# Patient Record
Sex: Female | Born: 1935 | Race: White | Hispanic: No | Smoking: Never smoker
Health system: Southern US, Community
[De-identification: ages and names within clinical notes are randomized; demographics above are authoritative.]

## PROBLEM LIST (undated history)

## (undated) DIAGNOSIS — I739 Peripheral vascular disease, unspecified: Secondary | ICD-10-CM

## (undated) DIAGNOSIS — F419 Anxiety disorder, unspecified: Secondary | ICD-10-CM

## (undated) DIAGNOSIS — I10 Essential (primary) hypertension: Secondary | ICD-10-CM

## (undated) DIAGNOSIS — E785 Hyperlipidemia, unspecified: Secondary | ICD-10-CM

## (undated) DIAGNOSIS — I4891 Unspecified atrial fibrillation: Secondary | ICD-10-CM

## (undated) DIAGNOSIS — I639 Cerebral infarction, unspecified: Secondary | ICD-10-CM

## (undated) HISTORY — PX: ABDOMINAL HYSTERECTOMY: SHX81

---

## 2004-05-03 ENCOUNTER — Ambulatory Visit: Payer: Self-pay | Admitting: Unknown Physician Specialty

## 2004-09-28 ENCOUNTER — Ambulatory Visit: Payer: Self-pay | Admitting: Ophthalmology

## 2004-10-05 ENCOUNTER — Ambulatory Visit: Payer: Self-pay | Admitting: Ophthalmology

## 2004-11-17 ENCOUNTER — Ambulatory Visit: Payer: Self-pay | Admitting: Ophthalmology

## 2004-11-23 ENCOUNTER — Ambulatory Visit: Payer: Self-pay | Admitting: Ophthalmology

## 2005-09-24 ENCOUNTER — Emergency Department: Payer: Self-pay | Admitting: Emergency Medicine

## 2005-10-03 ENCOUNTER — Ambulatory Visit: Payer: Self-pay | Admitting: Unknown Physician Specialty

## 2006-02-06 ENCOUNTER — Other Ambulatory Visit: Payer: Self-pay

## 2006-02-06 ENCOUNTER — Inpatient Hospital Stay: Payer: Self-pay | Admitting: Internal Medicine

## 2006-03-05 ENCOUNTER — Emergency Department: Payer: Self-pay | Admitting: Emergency Medicine

## 2006-03-07 ENCOUNTER — Ambulatory Visit: Payer: Self-pay | Admitting: Unknown Physician Specialty

## 2007-01-21 ENCOUNTER — Ambulatory Visit: Payer: Self-pay | Admitting: Unknown Physician Specialty

## 2007-01-22 ENCOUNTER — Ambulatory Visit: Payer: Self-pay | Admitting: Unknown Physician Specialty

## 2007-03-12 ENCOUNTER — Emergency Department: Payer: Self-pay | Admitting: Emergency Medicine

## 2007-03-12 ENCOUNTER — Other Ambulatory Visit: Payer: Self-pay

## 2008-04-14 ENCOUNTER — Emergency Department: Payer: Self-pay | Admitting: Emergency Medicine

## 2012-12-04 LAB — TROPONIN I
Troponin-I: <0.02 ng/mL
Troponin-I: <0.02 ng/mL

## 2012-12-04 LAB — COMPREHENSIVE METABOLIC PANEL
Albumin: 3.7 g/dL (ref 3.4–5.0)
Anion Gap: 6 — ABNORMAL LOW (ref 7–16)
Bilirubin,Total: 0.9 mg/dL (ref 0.2–1.0)
Chloride: 100 mmol/L (ref 98–107)
Co2: 24 mmol/L (ref 21–32)
Creatinine: 0.58 mg/dL — ABNORMAL LOW (ref 0.60–1.30)
EGFR (African American): >60
Potassium: 3.7 mmol/L (ref 3.5–5.1)
SGOT(AST): 28 U/L (ref 15–37)
SGPT (ALT): 21 U/L (ref 12–78)
Sodium: 130 mmol/L — ABNORMAL LOW (ref 136–145)

## 2012-12-04 LAB — URINALYSIS, COMPLETE
Bacteria: NONE SEEN
Bilirubin,UR: NEGATIVE
Blood: NEGATIVE
Glucose,UR: NEGATIVE mg/dL (ref 0–75)
Hyaline Cast: 1
Leukocyte Esterase: NEGATIVE
Nitrite: NEGATIVE
Ph: 7 (ref 4.5–8.0)
Protein: NEGATIVE
RBC,UR: 1 /HPF (ref 0–5)
Specific Gravity: 1.012 (ref 1.003–1.030)
Squamous Epithelial: NONE SEEN
WBC UR: <1 /HPF (ref 0–5)

## 2012-12-04 LAB — CK-MB: CK-MB: 4.2 ng/mL — ABNORMAL HIGH (ref 0.5–3.6)

## 2012-12-04 LAB — CBC
MCH: 29.2 pg (ref 26.0–34.0)
MCHC: 33.3 g/dL (ref 32.0–36.0)
RBC: 4.87 10*6/uL (ref 3.80–5.20)
WBC: 13.7 10*3/uL — ABNORMAL HIGH (ref 3.6–11.0)

## 2012-12-04 LAB — TSH: Thyroid Stimulating Horm: 0.877 u[IU]/mL

## 2012-12-05 ENCOUNTER — Inpatient Hospital Stay: Payer: Self-pay | Admitting: Student

## 2012-12-05 LAB — CBC WITH DIFFERENTIAL/PLATELET
Basophil #: 0 10*3/uL (ref 0.0–0.1)
Basophil %: 0.3 %
Eosinophil #: 0 10*3/uL (ref 0.0–0.7)
HCT: 40.4 % (ref 35.0–47.0)
HGB: 13.3 g/dL (ref 12.0–16.0)
Lymphocyte #: 0.8 10*3/uL — ABNORMAL LOW (ref 1.0–3.6)
MCH: 29.1 pg (ref 26.0–34.0)
MCHC: 32.9 g/dL (ref 32.0–36.0)
Monocyte #: 1.1 x10 3/mm — ABNORMAL HIGH (ref 0.2–0.9)
Monocyte %: 9.8 %
Neutrophil %: 83 %
Platelet: 223 10*3/uL (ref 150–440)
RDW: 14.5 % (ref 11.5–14.5)

## 2012-12-05 LAB — COMPREHENSIVE METABOLIC PANEL
Alkaline Phosphatase: 66 U/L (ref 50–136)
BUN: 12 mg/dL (ref 7–18)
Calcium, Total: 8 mg/dL — ABNORMAL LOW (ref 8.5–10.1)
Chloride: 103 mmol/L (ref 98–107)
Co2: 20 mmol/L — ABNORMAL LOW (ref 21–32)
Creatinine: 0.57 mg/dL — ABNORMAL LOW (ref 0.60–1.30)
Osmolality: 264 (ref 275–301)
SGPT (ALT): 30 U/L (ref 12–78)
Sodium: 132 mmol/L — ABNORMAL LOW (ref 136–145)

## 2012-12-05 LAB — CK-MB: CK-MB: 3.8 ng/mL — ABNORMAL HIGH (ref 0.5–3.6)

## 2013-06-02 ENCOUNTER — Emergency Department: Payer: Self-pay | Admitting: Emergency Medicine

## 2013-06-02 LAB — CBC
HCT: 37.3 % (ref 35.0–47.0)
HGB: 12.8 g/dL (ref 12.0–16.0)
MCH: 30.2 pg (ref 26.0–34.0)
MCHC: 34.4 g/dL (ref 32.0–36.0)
MCV: 88 fL (ref 80–100)
RDW: 14.4 % (ref 11.5–14.5)

## 2013-06-02 LAB — BASIC METABOLIC PANEL
Anion Gap: 6 — ABNORMAL LOW (ref 7–16)
BUN: 22 mg/dL — ABNORMAL HIGH (ref 7–18)
Chloride: 101 mmol/L (ref 98–107)
Co2: 26 mmol/L (ref 21–32)
Osmolality: 270 (ref 275–301)
Potassium: 4.1 mmol/L (ref 3.5–5.1)
Sodium: 133 mmol/L — ABNORMAL LOW (ref 136–145)

## 2013-06-02 LAB — URINALYSIS, COMPLETE
Bilirubin,UR: NEGATIVE
Ketone: NEGATIVE
Nitrite: NEGATIVE
Ph: 7 (ref 4.5–8.0)
RBC,UR: 9 /HPF (ref 0–5)
WBC UR: 52 /HPF (ref 0–5)

## 2014-10-23 NOTE — Consult Note (Signed)
PATIENT NAME:  Bridget BorerSUTTON, Illiana L MR#:  [id number] DATE OF BIRTH:  1914-09-08  DATE OF CONSULTATION:  12/05/2012  REFERRING PHYSICIAN:  Katharina Caperima Vaickute, MD   CONSULTING PHYSICIAN:  Lamar BlinksBruce J. Anaclara Acklin, MD  REASON FOR CONSULTATION: Atrial fibrillation with rapid ventricular rate with cerebrovascular accident, and carotid atherosclerosis and hypertension, needing further treatment.   CHIEF COMPLAINT: The patient has had disorientation and was admitted for that issue and, therefore, has had an EKG showing atrial fibrillation with controlled ventricular rate. Currently, the patient is stable and with no evidence of syncope.  The patient has had carotid atherosclerosis with previous carotid endarterectomy and previous multiple strokes, on appropriate medications including antiplatelet including Plavix. The patient has had no recent evidence of stroke, although disorientation may be a consideration at this time as a stroke or TIA.  The patient has had an EKG showing atrial fibrillation with controlled ventricular rate and has no evidence of significant congestive heart failure or myocardial infarction at this time.   REVIEW OF SYSTEMS: The remainder of review of systems is negative for syncope, dizziness, nausea, chest pain, diaphoresis, weakness, fatigue, vision change, ringing in the ears, hearing loss, cough, congestion, heartburn, nausea, vomiting, diarrhea, bloody stools, stomach pain, extremity pain, leg weakness, cramping of the buttocks, known blood clots, headaches, blackouts, dizzy spells, nosebleeds, frequent urination, urination at night.   PAST MEDICAL HISTORY: 1.  Multiple previous strokes.  2.  Atrial fibrillation.  3.  Carotid atherosclerosis, status post carotid endarterectomy.  4.  Hypertension.   FAMILY HISTORY: No family members with early onset of cardiovascular disease or hypertension.   SOCIAL HISTORY: Currently denies alcohol or tobacco use.   ALLERGIES:  As listed.    MEDICATIONS:  As listed.   PHYSICAL EXAMINATION: VITAL SIGNS: Blood pressure is 122/68 bilateral,  heart rate 74, upright, reclining and irregular.  GENERAL: She is a well-appearing, elderly female in no acute distress.  HEENT: No icterus, thyromegaly, ulcers, hemorrhage or xanthelasma. CARDIOVASCULAR: Irregularly irregular with normal S1, S2 without murmur, gallop or rub.  PMI is inferiorly displaced. Carotid upstroke normal without bruit. Jugular venous pressure is normal.  LUNGS: Have a few basilar crackles with normal respirations.  ABDOMEN: Soft, nontender, without hepatosplenomegaly or masses.  EXTREMITIES: 2+ bilateral pulses in dorsal, pedal, radial and femoral arteries without lower extremity edema, cyanosis, clubbing, or ulcers.  NEUROLOGIC: The patient is somewhat obtunded.  ASSESSMENT: A 81 year old female with history of cerebrovascular accident with obtundation consistent with TIA, having atrial fibrillation with controlled ventricular rate and concerns of fall risk by family reports, hypertension, needing further treatment options.   RECOMMENDATIONS: 1.  No additional medications management for atrial fibrillation due to controlled ventricular rate at this point and concerns of hypotension and fall risk.  2.  Antiplatelet medication management for previous strokes including Plavix as the patient tolerates.  3.  No full anticoagulation due to concerns of fall risk and bleeding risks.  4.  Echocardiogram for LV systolic dysfunction, valvular heart disease causing atrial fibrillation.  5.  Ambulate and follow for further stroke and symptoms.   ____________________________ Lamar BlinksBruce J. Tailer Volkert, MD [email or web address] D: 12/05/2012 18:58:32 ET T: 12/05/2012 20:15:05 ET JOB#: [id number]  cc: Lamar BlinksBruce J. Kanyla Omeara, MD, <Dictator> Lamar BlinksBRUCE J Tomica Arseneault MD ELECTRONICALLY SIGNED 12/09/2012 17:09

## 2014-10-23 NOTE — Consult Note (Signed)
CHIEF COMPLAINT and HISTORY:  Subjective/Chief Complaint confusion   History of Present Illness 81 yo WF who still lives at home alone.  Admitted with hallucinations.  Found to have right parietal infarcts on MR.  [place] suggested >75% left carotid stenosis.  Has a history of left CEA in 1996.  No significant right carotid stenosis on [place].  Has had multiple nonspecific TIAs previously.   PAST MEDICAL/SURGICAL HISTORY:  Past Medical History:   TIA - Transient Ischemic Attack:    HTN:    Hysterectomy - Total:    Cataract Extraction:   ALLERGIES:  Allergies:  Other -Explain in Comment: Unknown  HOME MEDICATIONS:  Home Medications: Medication Instructions Status  Plavix 75 mg oral tablet 1 tab(s) orally once a day Active  lisinopril 20 mg oral tablet 1 tab(s) orally once a day Active  Norvasc 5 mg oral tablet 1 tab(s) orally once a day Active  nitrofurantoin macrocrystals macrocrystals 50 mg oral capsule 1 cap(s) orally once a day Active   Family and Social History:  Family History Non-Contributory   Social History negative tobacco, negative ETOH   Place of Living Home   Review of Systems:  Fever/Chills No   Cough No   Sputum No   Abdominal Pain Yes   Diarrhea No   Constipation No   Nausea/Vomiting No   SOB/DOE No   Chest Pain No   Telemetry Reviewed Afib   Dysuria No   Tolerating PT Yes   Tolerating Diet Yes   Medications/Allergies Reviewed Medications/Allergies reviewed   Physical Exam:  GEN well developed, well nourished   HEENT pink conjunctivae, moist oral mucosa   NECK No masses  trachea midline   RESP normal resp effort  no use of accessory muscles   CARD irregular rate  positive carotid bruits  no JVD   ABD denies tenderness  normal BS   LYMPH negative neck, negative axillae   EXTR negative cyanosis/clubbing, negative edema   SKIN normal to palpation, skin turgor good   NEURO motor/sensory function intact, somewhat confused but  apparently much better than admission   PSYCH alert, A+O to time, place, person   LABS:  Laboratory Results: Thyroid:    04-Jun-14 09:26, Thyroid Stimulating Hormone  Thyroid Stimulating Hormone -  0.45-4.50  (International Unit)   -----------------------  Pregnant patients have   different reference   ranges for TSH:   - - - - - - - - - -   Pregnant, first trimetser:   0.36 - 2.50 uIU/mL    04-Jun-14 10:06, Thyroid Stimulating Hormone  Thyroid Stimulating Hormone 0.877  0.45-4.50  (International Unit)   -----------------------  Pregnant patients have   different reference   ranges for TSH:   - - - - - - - - - -   Pregnant, first trimetser:   0.36 - 2.50 uIU/mL  LabObservation:    05-Jun-14 08:36, Echo Doppler  OBSERVATION   Reason for Test  Hepatic:    04-Jun-14 09:26, Comprehensive Metabolic Panel  Bilirubin, Total -  Alkaline Phosphatase -  SGPT (ALT) -  SGOT (AST) -  Total Protein, Serum -  Albumin, Serum -    04-Jun-14 10:06, Comprehensive Metabolic Panel  Bilirubin, Total 0.9  Alkaline Phosphatase 70  SGPT (ALT) 21  SGOT (AST) 28  Total Protein, Serum 7.2  Albumin, Serum 3.7    05-Jun-14 02:22, Comprehensive Metabolic Panel  Bilirubin, Total 1.3  Alkaline Phosphatase 66  SGPT (ALT) 30  SGOT (AST) 37  Total Protein,  Serum 6.7  Albumin, Serum 3.3  Routine Micro:    04-Jun-14 10:42, Urine Culture  Micro Text Report   URINE CULTURE    COMMENT                   NO GROWTH IN 8-12 HOURS     ANTIBIOTIC  Specimen Source   IN AND OUT CATH  Culture Comment   NO GROWTH IN 8-12 HOURS   Result(s) reported on 05 Dec 2012 at 10:17AM.  Cardiology:    04-Jun-14 09:19, ED ECG  Ventricular Rate 123  Atrial Rate 133  QRS Duration 64  QT 398  QTc 569  R Axis -86  T Axis 71  ECG interpretation   Atrial fibrillation with rapid ventricular response with premature ventricular or aberrantly conducted complexes  Left axis deviation  Low voltage QRS  Inferior  infarct (cited on or before 06-Feb-2006)  Cannot rule out Anterior infarct (cited on or before 06-Feb-2006)  Abnormal ECG  When compared with ECG of 12-Mar-2007 14:08,  Atrial fibrillation has replaced Sinus rhythm  Vent. rate has increased BY  51 BPM  QRS duration has decreased  Nonspecific T wave abnormality now evident in Lateral leads  ----------unconfirmed----------  Confirmed by OVERREAD, NOT ([id number]), editor PEARSON, BARBARA ([id number]) on 12/05/2012 3:19:02 PM  ED ECG     05-Jun-14 08:36, Echo Doppler  Echo Doppler   REASON FOR EXAM:      COMMENTS:       PROCEDURE: [place] - ECHO DOPPLER COMPLETE(TRANSTHOR)  - Dec 05 2012  8:36AM     RESULT: Echocardiogram Report    Patient Name:   Bridget Livingston Date of Exam: 12/05/2012  Medical Rec #:  [id number]                 Custom1:  Date of Birth:  07-Aug-1914              Height:       61.8 in  Patient Age:    76 years               Weight:       127.9 lb  Patient Gender: F                      BSA:          1.58 m??    Indications: Atrial Fib  Sonographer:    Sherrie Sport RDCS  Referring Phys: Ether Griffins, [place]    Summary:   1. Left ventricular ejection fraction, by visual estimation, is 40 to   45%.   2. Mildly decreased global left ventricular systolic function.   3. Moderately enlarged right ventricle.   4. Severely dilated left atrium.   5. Severely dilated right atrium.   6. Moderate mitral valve regurgitation.   7. Moderate tricuspid regurgitation.   8. Mildly elevated pulmonary artery systolic pressure.  2D AND M-MODE MEASUREMENTS (normal ranges within parentheses):  Left Ventricle:          Normal  IVSd (2D):      0.85 cm (0.7-1.1)  LVPWd (2D):     0.81 cm (0.7-1.1) Aorta/LA:                  Normal  LVIDd (2D):     3.76 cm (3.4-5.7) Aortic Root (2D): 2.80 cm (2.4-3.7)  LVIDs (2D):     2.59 cm           Left Atrium (  2D): 3.90 cm (1.9-4.0)  LV FS (2D):     31.1 %   (>25%)  LV EF (2D):     59.7 %   (>50%)                                     Right Ventricle:                                    RVd (2D):        0.35 cm  LV DIASTOLIC FUNCTION:  MV Peak E: 1.18 m/s Decel Time: 116 msec  SPECTRAL DOPPLER ANALYSIS (where applicable):  Mitral Valve:  MV P1/2 Time: 33.64 msec  MV Area, PHT: 6.54 cm??  Aortic Valve: AoV Max Vel: 0.77 m/s AoV Peak PG: 2.4 mmHg AoV Mean PG:  LVOT Vmax: 0.42 m/s LVOT VTI:  LVOT Diameter: 2.20 cm  AoV Area, Vmax: 2.04 cm?? AoV Area, VTI:  AoV Area, Vmn:  Tricuspid Valve and PA/RV Systolic Pressure: TR Max Velocity: 2.83 m/s RA   Pressure: 5 mmHg RVSP/PASP: 37.0 mmHg  Pulmonic Valve:  PV Max Velocity: 0.56 m/s PV Max PG: 1.2 mmHg PV Mean PG:    PHYSICIAN INTERPRETATION:  Left Ventricle: The left ventricular internal cavity size was normal. LV   posterior wall thickness was normal. Global LV systolic function was   mildly decreased. Left ventricular ejection fraction, by visual  estimation, is 40 to 45%.  Right Ventricle: The right ventricular size is moderately enlarged.  Left Atrium: The left atrium is severely dilated.  Right Atrium: The right atrium is severely dilated.  Pericardium: There is no evidence of pericardialeffusion.  Mitral Valve: Moderate mitral valve regurgitation is seen.  Tricuspid Valve: Moderate tricuspid regurgitation is visualized. The   tricuspid regurgitant velocity is 2.83 m/s, and with an assumed right   atrial pressure of 5 mmHg, the estimated right ventricular systolic   pressure is mildly elevated at 37.0 mmHg.  Aortic Valve: The aortic valve is trileaflet and structurally normal,   with normal leaflet excursion; without any evidence of aortic stenosis or   insufficiency.  Aorta: Theaortic root and ascending aorta are structurally normal, with   no evidence of dilitation.    [id number] Serafina Royals MD  Electronically signed by [id number] Serafina Royals MD  Signature Date/Time: 12/05/2012/1:48:30 PM  *** Final ***    IMPRESSION: .        Verified By: Corey Skains  ([facility]), M.D., MD  Routine Chem:    04-Jun-14 09:26, Comprehensive Metabolic Panel  Glucose, Serum -  BUN -  Creatinine (comp) -  Sodium, Serum -  Potassium, Serum -  Chloride, Serum -  CO2, Serum -  Calcium (Total), Serum -  Osmolality (calc) -  eGFR (African American) -  eGFR (Non-African American) -  eGFR values <70mL/min/1.73 m2 may be an indication of chronic  kidney disease (CKD).  Calculated eGFR is useful in patients with stable renal function.  The eGFR calculation will not be reliable in acutely ill patients  when serum creatinine is changing rapidly. It is not useful in   patients on dialysis. The eGFR calculation may not be applicable  to patients at the low and high extremes of body sizes, pregnant  women, and vegetarians.  Anion Gap -    04-Jun-14 10:06, Comprehensive Metabolic Panel  Glucose, Serum 110  BUN 13  Creatinine (comp) 0.58  Sodium, Serum 130  Potassium, Serum 3.7  Chloride, Serum 100  CO2, Serum 24  Calcium (Total), Serum 8.6  Osmolality (calc) 262  eGFR (African American) >60  eGFR (Non-African American) >60  eGFR values <6mL/min/1.73 m2 may be an indication of chronic  kidney disease (CKD).  Calculated eGFR is useful in patients with stable renal function.  The eGFR calculation will not be reliable in acutely ill patients  when serum creatinine is changing rapidly. It is not useful in   patients on dialysis. The eGFR calculation may not be applicable  to patients at the low and high extremes of body sizes, pregnant  women, and vegetarians.  Anion Gap 6    05-Jun-14 02:22, Comprehensive Metabolic Panel  Glucose, Serum 102  BUN 12  Creatinine (comp) 0.57  Sodium, Serum 132  Potassium, Serum 3.9  Chloride, Serum 103  CO2, Serum 20  Calcium (Total), Serum 8.0  Osmolality (calc) 264  eGFR (African American) >60  eGFR (Non-African American) >60  eGFR values <66mL/min/1.73 m2 may be an indication of chronic  kidney disease  (CKD).  Calculated eGFR is useful in patients with stable renal function.  The eGFR calculation will not be reliable in acutely ill patients  when serum creatinine is changing rapidly. It is not useful in   patients on dialysis. The eGFR calculation may not be applicable  to patients at the low and high extremes of body sizes, pregnant  women, and vegetarians.  Anion Gap 9  Cardiac:    04-Jun-14 09:26, CK, Total  CK, Total -  Result(s) reported on 04 Dec 2012 at 09:59AM.    04-Jun-14 09:26, Troponin I  Troponin I -  0.00-0.05  0.05 ng/mL or less: NEGATIVE   Repeat testing in 3-6 hrs   if clinically indicated.  >0.05 ng/mL: POTENTIAL   MYOCARDIAL INJURY. Repeat   testing in 3-6 hrs if   clinically indicated.  NOTE: An increase or decrease   of 30% or more on serial   testing suggests a   clinically important change    04-Jun-14 10:06, CK, Total  CK, Total 305  Result(s) reported on 04 Dec 2012 at 10:39AM.    04-Jun-14 10:06, Troponin I  Troponin I < 0.02  0.00-0.05  0.05 ng/mL or less: NEGATIVE   Repeat testing in 3-6 hrs   if clinically indicated.  >0.05 ng/mL: POTENTIAL   MYOCARDIAL INJURY. Repeat   testing in 3-6 hrs if   clinically indicated.  NOTE: An increase or decrease   of 30% or more on serial   testing suggests a   clinically important change    04-Jun-14 18:31, CPK-MB, Serum  CPK-MB, Serum 4.2  Result(s) reported on 04 Dec 2012 at 07:23PM.    04-Jun-14 18:31, Troponin I  Troponin I < 0.02  0.00-0.05  0.05 ng/mL or less: NEGATIVE   Repeat testing in 3-6 hrs   if clinically indicated.  >0.05 ng/mL: POTENTIAL   MYOCARDIAL INJURY. Repeat   testing in 3-6 hrs if   clinically indicated.  NOTE: An increase or decrease   of 30% or more on serial   testing suggests a   clinically important change    05-Jun-14 02:22, CPK-MB, Serum  CPK-MB, Serum 3.8  Result(s) reported on 05 Dec 2012 at 03:00AM.    05-Jun-14 02:22, Troponin I  Troponin I < 0.02   0.00-0.05  0.05 ng/mL or less: NEGATIVE   Repeat testing  in 3-6 hrs   if clinically indicated.  >0.05 ng/mL: POTENTIAL   MYOCARDIAL INJURY. Repeat   testing in 3-6 hrs if   clinically indicated.  NOTE: An increase or decrease   of 30% or more on serial   testing suggests a   clinically important change  Routine UA:    04-Jun-14 10:42, Urinalysis  Color (UA) Yellow  Clarity (UA) Clear  Glucose (UA) Negative  Bilirubin (UA) Negative  Ketones (UA) 1+  Specific Gravity (UA) 1.012  Blood (UA) Negative  pH (UA) 7.0  Protein (UA) Negative  Nitrite (UA) Negative  Leukocyte Esterase (UA) Negative  Result(s) reported on 04 Dec 2012 at 11:29AM.  RBC (UA) 1 /HPF  WBC (UA) <1 /HPF  Bacteria (UA)   NONE SEEN  Epithelial Cells (UA)   NONE SEEN  Mucous (UA) PRESENT  Hyaline Cast (UA) 1 /LPF  Result(s) reported on 04 Dec 2012 at 11:29AM.  Routine Hem:    04-Jun-14 09:26, Hemogram, Platelet Count  WBC (CBC) 13.7  RBC (CBC) 4.87  Hemoglobin (CBC) 14.2  Hematocrit (CBC) 42.7  Platelet Count (CBC) 259  Result(s) reported on 04 Dec 2012 at 09:39AM.  MCV 88  MCH 29.2  MCHC 33.3  RDW 14.6    05-Jun-14 02:22, CBC Profile  WBC (CBC) 11.6  RBC (CBC) 4.57  Hemoglobin (CBC) 13.3  Hematocrit (CBC) 40.4  Platelet Count (CBC) 223  MCV 88  MCH 29.1  MCHC 32.9  RDW 14.5  Neutrophil % 83.0  Lymphocyte % 6.6  Monocyte % 9.8  Eosinophil % 0.3  Basophil % 0.3  Neutrophil # 9.6  Lymphocyte # 0.8  Monocyte # 1.1  Eosinophil # 0.0  Basophil # 0.0  Result(s) reported on 05 Dec 2012 at 02:42AM.   RADIOLOGY:  Radiology Results: XRay:    04-Jun-14 09:46, Chest PA and Lateral  Chest PA and Lateral  REASON FOR EXAM:    altered mental status  COMMENTS:       PROCEDURE: DXR - DXR CHEST PA (OR AP) AND LATERAL  - Dec 04 2012  9:46AM     RESULT: The cardiac silhouette is enlarged. The left hemidiaphragm is   obscured. The pulmonary vascularity is notengorged. The bony thorax   exhibits  no acute abnormality. There is no definite pleural effusion on   the lateral film.    IMPRESSION:   1. I do not see evidence of acute thoracic injury.  2. There is enlargement of the cardiac silhouette which may reflect   underlying CHF.  3. There is no pneumothorax or pleural effusion.   Dictation Site: 2        Verified By: DAVID A. Martinique, M.D., MD  [place]:    05-Jun-14 10:53, US Carotid Doppler Bilateral  US Carotid Doppler Bilateral  REASON FOR EXAM:    confused, r/o CVA  COMMENTS:       PROCEDURE: [place]  - US CAROTID DOPPLER BILATERAL  - Dec 05 2012 10:53AM     RESULT: Grayscale and color flow Doppler techniques were employed to   evaluate the cervical carotid systems.    On the right there is a moderate amount of calcified and soft plaque in   the distal CCA and bulb and proximal ICA. The waveform patterns and color   flow images do not suggest significant stenotic lesions. On the right the   peak internal carotid systolic velocity measured 71 cm/sec and the peak   common carotid velocity measured 59 cm/  sec corresponding to a ratio of   1.2.  On the left is somewhat more calcified and soft plaque demonstrated   especially in the distal CCA where the degree of visual stenosis is   between 75 and 95%. The waveform patterns on the left demonstrate minimal   turbulence. On the left the peak internal carotid systolic velocity   measured 43 cm/sec and the peak common carotid velocity measured 375   cm/sec. This results in avery low ratio of 0.1. This is due to the fact   that the stenotic lesion is in the distal CCA rather than at the bulb or   proximal ICA.    The vertebral arteries are normal in flow direction bilaterally.    IMPRESSION:    1. There is a considerableamount of calcified and soft plaque in the   distal CCA with the degree of stenosis in the 75 to 95% range. This is   based on considerable acceleration of the peak common carotid velocity to     375 cm/sec.   The carotid bulb and proximal ICA on the left reveal no   significant stenoses.  2. On the right there is no evidence of a hemodynamically significant   stenosis.  3. The vertebral arteries are normal in flow direction bilaterally.  4. The cardiac rhythm is seen to be irregular.     Dictation Site: 2        Verified By: DAVID A. Martinique, M.D., MD  LabUnknown:    04-Jun-14 09:33, CT Head Without Contrast  PACS Image    04-Jun-14 09:46, Chest PA and Lateral  PACS Image    05-Jun-14 09:58, MRI Brain Without Contrast  PACS Image    05-Jun-14 10:53, US Carotid Doppler Bilateral  PACS Image    05-Jun-14 15:22, CT Angiography Neck (Carotids)  PACS Image  MRI:    05-Jun-14 09:58, MRI Brain Without Contrast  MRI Brain Without Contrast  REASON FOR EXAM:    confused, hallucinating, CVA?  COMMENTS:       PROCEDURE: MR  - MR BRAIN WO CONTRAST  - Dec 05 2012  9:58AM     RESULT: History: Confused    Technique: Multiplanar, multisequence MRI of the brain was obtained   without IV contrast.     Comparison:  None    Findings:     There are punctate foci of restricted diffusion in the right parietal     lobe with corresponding ADC hypointensity consistent with punctate acute   infarcts. There is no hemorrhage. There is no pathologic extra-axial   fluid collection. There is generalized cerebral atrophy. There is   periventricular and deep white matter T2 and FLAIR hyperintensity likely   secondary to microangiopathy. There is no hydrocephalus. The ventricles   are normal for age. The basal cisterns are patent. There are no abnormal   extra-axial fluid collections.     The visualized paranasal sinuses and mastoid sinuses are clear. The skull   base and calvarium demonstrate normal signal. The major intracranial flow   voids, including dural venous sinuses appear patent.    IMPRESSION:     Two punctate acute nonhemorrhagic right parietal lobe infarcts.  Dictation Site:  1        Verified By: Jennette Banker, M.D., MD  CT:    04-Jun-14 09:33, CT Head Without Contrast  CT Head Without Contrast  REASON FOR EXAM:    altered mental status  COMMENTS:  PROCEDURE: CT  - CT HEAD WITHOUT CONTRAST  - Dec 04 2012  9:33AM     RESULT: Comparison:  None    Technique: Multiple axial images from the foramen magnum to the vertex   were obtained without IV contrast.    Findings:      There is no evidence of mass effect, midline shift, or extra-axial fluid   collections.  There is no evidence of a space-occupying lesion or   intracranial hemorrhage. There is no evidence of a cortical-based area of     acute infarction. There is generalized cerebral atrophy. There is   periventricular white matter low attenuation likely secondary to   microangiopathy.    The ventricles and sulci are appropriate for the patient's age. The basal   cisterns are patent.    Visualized portions of the orbits are unremarkable. The visualized   portions of the paranasal sinuses and mastoid air cells are unremarkable.   Cerebrovascular atherosclerotic calcifications are noted.    The osseous structures are unremarkable.    IMPRESSION:    No acute intracranial process.        Dictation Site: 1        Verified By: Jennette Banker, M.D., MD    05-Jun-14 15:22, CT Angiography Neck (Carotids)  CT Angiography Neck (Carotids)  REASON FOR EXAM:    CVA with left carotid stenosis on ultrasound  COMMENTS:       PROCEDURE: CT  - CT ANGIOGRAPHY NECK W/CONTRAST  - Dec 05 2012  3:22PM     RESULT: Indication: CVA    Comparisons: None    Technique: 100 ml of Isovue 370 was administered and the extracranial   carotid arteries bilaterally were scanned during the arterial phase from   the level of the superior portion of the frontal sinuses to the proximal   neck. These images were then transferred to the Siemens work station and   were subsequently reviewed utilizing 3-D  reconstructions and MIP images.  Findings:     A three-vessel configuration of the aortic arch is identified with normal   ostium. The vertebral arteries are identified arising from the subclavian   arteries and entering the transverse foramen bilaterally at C6.     The carotid arteries are identified and are symmetric and unremarkable.   There is no evidence of aneurysm or carotid dissection bilaterally.     Right Carotid Artery: There is mild apical right plaque within the right   carotid bulb and proximal ICA. There is less than 50% stenosis.    Left Carotid Artery: There is moderate distal left CCA atherosclerotic   plaque resulting in greater than 75% stenosis. There is mild   atherosclerotic plaque within the carotid bulb without focal stenosis.  The visualized portions of the brain are unremarkable.     There is degenerative disc disease with discogenic endplate osteophytes   and the space narrowing at C5-C6 and C6-C7.    There is a small right pleural effusion.    IMPRESSION:     1. Moderate distal left CCA atherosclerotic plaque resulting in greater   than 75% stenosis.    2. Less than 50% right carotid artery stenosis.    Dictation Site: 1    Verified By: Jennette Banker, M.D., MD   ASSESSMENT AND PLAN:  Assessment/Admission Diagnosis recurrent left carotid stenosis right sided stroke   Plan The recurrent left ICA stenosis is not likely the cause of her stroke.  A.  fib and BP issues much more likely to be the cause.  CT angiogram bein performed and I will review.  If this is an asymptomatic recurrent left carotid stenosis in a 81 yo, medical management alone is likely appropriate.  Will review CT angiogram and follow up with patient tomorrow.   level 4   Electronic Signatures: Algernon Huxley (MD)  (Signed 05-Jun-14 16:48)  Authored: Chief Complaint and History, PAST MEDICAL/SURGICAL HISTORY, ALLERGIES, HOME MEDICATIONS, Family and Social History, Review of  Systems, Physical Exam, LABS, RADIOLOGY, Assessment and Plan   Last Updated: 05-Jun-14 16:48 by Algernon Huxley (MD)
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
## 2014-10-23 NOTE — Discharge Summary (Signed)
PATIENT NAME:  Bridget Livingston, Bridget Livingston MR#:  [id number] DATE OF BIRTH:  [birth year]  DATE OF ADMISSION:  12/05/2012 DATE OF DISCHARGE:  06/092014  CONSULTANTS: 1.  Dr. Wyn Quaker from vascular surgery. 2.  Dr. Gwen Pounds from cardiology.   CHIEF COMPLAINT: Fall.   DISCHARGE DIAGNOSES: 1.  Two punctate acute nonhemorrhagic right parietal lobe infarcts.  2.  Chronic atrial fibrillation.  3.  A 75% carotid artery stenosis on the left.  4.  History of carotid endarterectomy on the left.  5.  Hypertension.  6.  Hyperlipidemia.  7.  History of recurrent transient ischemic attacks.  8.  History of peripheral vascular disease.  9.  History of generalized weakness.  10.  History of gait instability.  11.  Acute onset of confusion and hallucinations possibly from the acute infarcts, back to baseline now.   DISCHARGE MEDICATIONS:  Plavix 75 mg daily, lisinopril 20 mg daily, Norvasc 5 mg daily, rosuvastatin 5 mg daily, metoprolol tartrate 50 mg every 12 hours, calcium with vitamin D 1 tab once a day.   DIET: Low sodium.   ACTIVITY: As tolerated.   FOLLOWUP: Please follow with PCP, cardiologist and vascular surgery within 1 to 2 weeks.   DISPOSITION: To rehab.    CODE STATUS:  The patient is a FULL CODE.    SIGNIFICANT LABS AND IMAGING:  CT of the head without contrast on June 4th: No acute intracranial process.  CT angio of the neck on June 5th: Showing moderate distal left CCA atherosclerotic plaque resulting in greater than 75% stenosis, less than 70% right-sided carotid artery stenosis.  MRI of the brain: As above.  Ultrasound of the carotids, bilateral:  Showing considerable amount of calcified and soft plaque in the distal common carotid artery 75 to 95% on the left; on the right, no evidence of hemodynamically significant stenosis. Vertebral arteries are normal in flow direction bilaterally. Initial BUN 13, creatinine 0.58, sodium 130. Last sodium 132. LFTs on arrival within normal limits. Troponin  negative x 3. Initial CK total of 305. Initial WBC 13.7, hemoglobin 14.2, platelets 259. Urinalysis not suggestive of infection. Urine cultures no growth to date. Echocardiogram: Showing EF of 40 to 45%, severely dilated left atrium, severely dilated right atrium, moderate mitral valve regurgitation and tricuspid regurgitation. Mildly elevated pulmonary artery systolic pressure.   HISTORY AND PHYSICAL: For full details of the H and P, please see the dictation done June 4th by Dr. Winona Legato; but briefly, this is a pleasant 81 year old female with history of recurrent TIAs, history of hypertension on Plavix who came in after a fall. She also had some confusion, hallucinations and was seeing things in the house.  She was found on the floor. She was admitted to the hospitalist service.   HOSPITAL COURSE:  She was noted to have chronic a-fib. She had mild leukocytosis and mild elevation of CK total likely secondary to a fall. She had a CT of the head which was negative for acute stroke; nonetheless, given her history of confusion, hallucinations which were sudden onset, stroke was suspected, and the patient underwent further studies including MRI of the brain which did find 2 punctate areas of strokes. Ultrasound of the carotids was done, the result of which is above, and a CT angio was done and vascular surgery was consulted. Cardiology was consulted for the paroxysmal atrial fibrillation, and the patient underwent an echocardiogram as well showing EF of 40% to 45%. The patient does not appear to be in acute CHF, and this  is all probably chronic systolic CHF. In regards to the acute onset of confusion, hallucinations and fall, this could be from the acute infarcts, and this is improving, and she is back to baseline. She has been started on a statin, Plavix was resumed. Anticoagulation was offered, but she is fall risk, and per cardiology and family not a good candidate. She seen by PT and will be discharged to rehab.  She is to follow with Dr. Wyn Quakerew as an outpatient for the carotid artery stenosis. Her a-fib is controlled. Her blood pressure is acceptable for age at this point. Her blood pressure medications have been adjusted, and amlodipine, metoprolol and ACE inhibitor will be given to her after discharge.  TOTAL TIME SPENT: 35 minutes.   ____________________________ Krystal EatonShayiq Clementine Soulliere, MD sa:cb D: 12/09/2012 13:30:34 ET T: 12/09/2012 14:34:23 ET JOB#: [id number]  cc: Krystal EatonShayiq Marelly Wehrman, MD, <Dictator> Reola MosherAndrew S. Randa LynnLamb, MD Annice NeedyJason S. Dew, MD Lamar BlinksBruce J. Kowalski, MD Krystal EatonSHAYIQ Kort Stettler MD ELECTRONICALLY SIGNED 12/27/2012 20:58

## 2014-12-23 ENCOUNTER — Encounter: Payer: Self-pay | Admitting: Emergency Medicine

## 2014-12-23 ENCOUNTER — Inpatient Hospital Stay
Admission: EM | Admit: 2014-12-23 | Discharge: 2014-12-26 | DRG: 683 | Disposition: A | Discharge status: Skilled Nursing Facility | Payer: Medicare Other | Attending: Internal Medicine | Admitting: Internal Medicine

## 2014-12-23 DIAGNOSIS — Z8673 Personal history of transient ischemic attack (TIA), and cerebral infarction without residual deficits: Secondary | ICD-10-CM

## 2014-12-23 DIAGNOSIS — Z515 Encounter for palliative care: Secondary | ICD-10-CM | POA: Diagnosis not present

## 2014-12-23 DIAGNOSIS — I4891 Unspecified atrial fibrillation: Secondary | ICD-10-CM | POA: Diagnosis present

## 2014-12-23 DIAGNOSIS — N19 Unspecified kidney failure: Secondary | ICD-10-CM

## 2014-12-23 DIAGNOSIS — N390 Urinary tract infection, site not specified: Secondary | ICD-10-CM | POA: Diagnosis present

## 2014-12-23 DIAGNOSIS — I739 Peripheral vascular disease, unspecified: Secondary | ICD-10-CM | POA: Diagnosis present

## 2014-12-23 DIAGNOSIS — E876 Hypokalemia: Secondary | ICD-10-CM | POA: Diagnosis present

## 2014-12-23 DIAGNOSIS — F0391 Unspecified dementia with behavioral disturbance: Secondary | ICD-10-CM | POA: Diagnosis present

## 2014-12-23 DIAGNOSIS — F419 Anxiety disorder, unspecified: Secondary | ICD-10-CM | POA: Diagnosis present

## 2014-12-23 DIAGNOSIS — E870 Hyperosmolality and hypernatremia: Secondary | ICD-10-CM

## 2014-12-23 DIAGNOSIS — F03918 Unspecified dementia, unspecified severity, with other behavioral disturbance: Secondary | ICD-10-CM

## 2014-12-23 DIAGNOSIS — Z66 Do not resuscitate: Secondary | ICD-10-CM | POA: Diagnosis present

## 2014-12-23 DIAGNOSIS — I10 Essential (primary) hypertension: Secondary | ICD-10-CM | POA: Diagnosis present

## 2014-12-23 DIAGNOSIS — R197 Diarrhea, unspecified: Secondary | ICD-10-CM | POA: Diagnosis present

## 2014-12-23 DIAGNOSIS — E871 Hypo-osmolality and hyponatremia: Secondary | ICD-10-CM | POA: Diagnosis present

## 2014-12-23 DIAGNOSIS — E860 Dehydration: Secondary | ICD-10-CM | POA: Diagnosis present

## 2014-12-23 DIAGNOSIS — N179 Acute kidney failure, unspecified: Secondary | ICD-10-CM | POA: Diagnosis present

## 2014-12-23 DIAGNOSIS — E785 Hyperlipidemia, unspecified: Secondary | ICD-10-CM | POA: Diagnosis present

## 2014-12-23 HISTORY — DX: Unspecified atrial fibrillation: I48.91

## 2014-12-23 HISTORY — DX: Essential (primary) hypertension: I10

## 2014-12-23 HISTORY — DX: Peripheral vascular disease, unspecified: I73.9

## 2014-12-23 HISTORY — DX: Hyperlipidemia, unspecified: E78.5

## 2014-12-23 HISTORY — DX: Anxiety disorder, unspecified: F41.9

## 2014-12-23 HISTORY — DX: Cerebral infarction, unspecified: I63.9

## 2014-12-23 LAB — URINALYSIS COMPLETE WITH MICROSCOPIC (ARMC ONLY)
Bacteria, UA: NONE SEEN
Bilirubin Urine: NEGATIVE
Glucose, UA: NEGATIVE mg/dL
Hgb urine dipstick: NEGATIVE
Nitrite: NEGATIVE
PH: 5 (ref 5.0–8.0)
PROTEIN: 30 mg/dL — AB
Specific Gravity, Urine: 1.023 (ref 1.005–1.030)

## 2014-12-23 LAB — COMPREHENSIVE METABOLIC PANEL
ALK PHOS: 66 U/L (ref 38–126)
ALT: 19 U/L (ref 14–54)
AST: 24 U/L (ref 15–41)
Albumin: 3.3 g/dL — ABNORMAL LOW (ref 3.5–5.0)
Anion gap: 14 (ref 5–15)
BILIRUBIN TOTAL: 0.7 mg/dL (ref 0.3–1.2)
BUN: 47 mg/dL — ABNORMAL HIGH (ref 6–20)
CO2: 22 mmol/L (ref 22–32)
Calcium: 9.2 mg/dL (ref 8.9–10.3)
Chloride: 114 mmol/L — ABNORMAL HIGH (ref 101–111)
Creatinine, Ser: 1.67 mg/dL — ABNORMAL HIGH (ref 0.44–1.00)
GFR calc non Af Amer: 24 mL/min — ABNORMAL LOW (ref >60)
GFR, EST AFRICAN AMERICAN: 28 mL/min — AB (ref >60)
Glucose, Bld: 116 mg/dL — ABNORMAL HIGH (ref 65–99)
POTASSIUM: 3.4 mmol/L — AB (ref 3.5–5.1)
Sodium: 150 mmol/L — ABNORMAL HIGH (ref 135–145)
TOTAL PROTEIN: 7.6 g/dL (ref 6.5–8.1)

## 2014-12-23 LAB — CBC
HCT: 39.1 % (ref 35.0–47.0)
Hemoglobin: 12.6 g/dL (ref 12.0–16.0)
MCH: 28.7 pg (ref 26.0–34.0)
MCHC: 32.2 g/dL (ref 32.0–36.0)
MCV: 89 fL (ref 80.0–100.0)
PLATELETS: 249 10*3/uL (ref 150–440)
RBC: 4.4 MIL/uL (ref 3.80–5.20)
RDW: 15.1 % — ABNORMAL HIGH (ref 11.5–14.5)
WBC: 9.5 10*3/uL (ref 3.6–11.0)

## 2014-12-23 MED ORDER — ONDANSETRON HCL 4 MG/2ML IJ SOLN: 4.0000 mg | Freq: Four times a day (QID) | INTRAMUSCULAR | Status: DC | PRN

## 2014-12-23 MED ORDER — DIAZEPAM 5 MG/ML IJ SOLN
2.5000 mg | Freq: Once | INTRAMUSCULAR | Status: AC
  Administered 2014-12-23: 2.5 mg via INTRAVENOUS

## 2014-12-23 MED ORDER — CETYLPYRIDINIUM CHLORIDE 0.05 % MT LIQD
7.0000 mL | Freq: Two times a day (BID) | OROMUCOSAL | Status: DC
  Administered 2014-12-23 – 2014-12-25 (×4): 7 mL via OROMUCOSAL

## 2014-12-23 MED ORDER — CALCIUM CARBONATE-VITAMIN D 500-200 MG-UNIT PO TABS
1.0000 | ORAL_TABLET | Freq: Every day | ORAL | Status: DC
  Administered 2014-12-24 – 2014-12-25 (×2): 1 via ORAL
  Filled 2014-12-23 (×3): qty 1

## 2014-12-23 MED ORDER — LISINOPRIL 20 MG PO TABS: 20.0000 mg | ORAL_TABLET | Freq: Every day | ORAL | Status: DC

## 2014-12-23 MED ORDER — DIAZEPAM 5 MG/ML IJ SOLN
INTRAMUSCULAR | Status: AC
  Administered 2014-12-23: 2.5 mg via INTRAVENOUS
  Filled 2014-12-23: qty 2

## 2014-12-23 MED ORDER — ADULT MULTIVITAMIN W/MINERALS CH
1.0000 | ORAL_TABLET | Freq: Every day | ORAL | Status: DC
  Administered 2014-12-24 – 2014-12-25 (×2): 1 via ORAL
  Filled 2014-12-23 (×3): qty 1

## 2014-12-23 MED ORDER — ACETAMINOPHEN 325 MG PO TABS: 650.0000 mg | ORAL_TABLET | ORAL | Status: DC | PRN

## 2014-12-23 MED ORDER — QUETIAPINE FUMARATE 25 MG PO TABS
25.0000 mg | ORAL_TABLET | Freq: Every day | ORAL | Status: DC
  Administered 2014-12-24 – 2014-12-25 (×2): 25 mg via ORAL
  Filled 2014-12-23 (×3): qty 1

## 2014-12-23 MED ORDER — ATORVASTATIN CALCIUM 20 MG PO TABS
20.0000 mg | ORAL_TABLET | Freq: Every day | ORAL | Status: DC
  Administered 2014-12-24 – 2014-12-25 (×2): 20 mg via ORAL
  Filled 2014-12-23 (×3): qty 1

## 2014-12-23 MED ORDER — LORATADINE 10 MG PO TABS: 10.0000 mg | ORAL_TABLET | Freq: Every day | ORAL | Status: DC | PRN

## 2014-12-23 MED ORDER — METOPROLOL TARTRATE 25 MG PO TABS
25.0000 mg | ORAL_TABLET | Freq: Two times a day (BID) | ORAL | Status: DC
  Administered 2014-12-23 – 2014-12-25 (×5): 25 mg via ORAL
  Filled 2014-12-23 (×6): qty 1

## 2014-12-23 MED ORDER — DIPHENHYDRAMINE HCL 25 MG PO TABS
25.0000 mg | ORAL_TABLET | Freq: Four times a day (QID) | ORAL | Status: DC | PRN
  Filled 2014-12-23: qty 1

## 2014-12-23 MED ORDER — QUETIAPINE FUMARATE 25 MG PO TABS
50.0000 mg | ORAL_TABLET | Freq: Every evening | ORAL | Status: DC
  Administered 2014-12-25: 50 mg via ORAL
  Filled 2014-12-23: qty 2

## 2014-12-23 MED ORDER — LORAZEPAM 0.5 MG PO TABS
0.5000 mg | ORAL_TABLET | Freq: Three times a day (TID) | ORAL | Status: DC | PRN
  Administered 2014-12-23 – 2014-12-24 (×2): 0.5 mg via ORAL
  Filled 2014-12-23 (×2): qty 1

## 2014-12-23 MED ORDER — CLOBETASOL PROPIONATE 0.05 % EX CREA
1.0000 "application " | TOPICAL_CREAM | Freq: Two times a day (BID) | CUTANEOUS | Status: DC | PRN
  Filled 2014-12-23: qty 15

## 2014-12-23 MED ORDER — ONDANSETRON HCL 4 MG PO TABS: 4.0000 mg | ORAL_TABLET | Freq: Four times a day (QID) | ORAL | Status: DC | PRN

## 2014-12-23 MED ORDER — TRIAMCINOLONE ACETONIDE 0.1 % EX CREA
1.0000 "application " | TOPICAL_CREAM | Freq: Two times a day (BID) | CUTANEOUS | Status: DC | PRN
  Filled 2014-12-23: qty 15

## 2014-12-23 MED ORDER — ACETAMINOPHEN 325 MG PO TABS: 650.0000 mg | ORAL_TABLET | Freq: Four times a day (QID) | ORAL | Status: DC | PRN

## 2014-12-23 MED ORDER — AMLODIPINE BESYLATE 5 MG PO TABS
5.0000 mg | ORAL_TABLET | Freq: Every day | ORAL | Status: DC
  Administered 2014-12-24 – 2014-12-25 (×2): 5 mg via ORAL
  Filled 2014-12-23 (×3): qty 1

## 2014-12-23 MED ORDER — ENOXAPARIN SODIUM 40 MG/0.4ML ~~LOC~~ SOLN: 40.0000 mg | SUBCUTANEOUS | Status: DC

## 2014-12-23 MED ORDER — HEPARIN SODIUM (PORCINE) 1000 UNIT/ML IJ SOLN
5000.0000 [IU] | Freq: Two times a day (BID) | INTRAMUSCULAR | Status: DC
  Filled 2014-12-23 (×2): qty 5

## 2014-12-23 MED ORDER — POTASSIUM CL IN DEXTROSE 5% 20 MEQ/L IV SOLN
20.0000 meq | INTRAVENOUS | Status: DC
  Administered 2014-12-23 – 2014-12-25 (×5): 20 meq via INTRAVENOUS
  Filled 2014-12-23 (×8): qty 1000

## 2014-12-23 MED ORDER — CLOPIDOGREL BISULFATE 75 MG PO TABS
75.0000 mg | ORAL_TABLET | Freq: Every day | ORAL | Status: DC
  Administered 2014-12-24 – 2014-12-25 (×2): 75 mg via ORAL
  Filled 2014-12-23 (×3): qty 1

## 2014-12-23 MED ORDER — TRIAMCINOLONE ACETONIDE 0.1 % EX OINT
TOPICAL_OINTMENT | Freq: Two times a day (BID) | CUTANEOUS | Status: DC | PRN
Start: 2014-12-23 — End: 2014-12-26
  Filled 2014-12-23: qty 15

## 2014-12-23 MED ORDER — ACETAMINOPHEN 650 MG RE SUPP: 650.0000 mg | Freq: Four times a day (QID) | RECTAL | Status: DC | PRN

## 2014-12-23 MED ORDER — HEPARIN SODIUM (PORCINE) 5000 UNIT/ML IJ SOLN
5000.0000 [IU] | Freq: Two times a day (BID) | INTRAMUSCULAR | Status: DC
  Administered 2014-12-23 – 2014-12-26 (×6): 5000 [IU] via SUBCUTANEOUS
  Filled 2014-12-23 (×6): qty 1

## 2014-12-23 NOTE — ED Provider Notes (Signed)
[facility] Emergency Department Provider Note  ____________________________________________  Time seen: 1035  I have reviewed the triage vital signs and the nursing notes.   HISTORY  Chief Complaint Weakness  diarrhea  change in personality, acute on chronic    HPI Bridget Livingston is a 81 y.o. female who resides at [facility] in assisted living. She had a stroke 2 years ago and the daughter reports she has been more temperamental since then. The daughter reports she has been a strong-willed personal whole life. Now, the staff at [facility] reports that the patient has been more difficult to work with for the past few days. They report she is been weak. The daughter reports she has been having diarrhea.  The patient denies any acute issues. She says she feels fine. She does say this with an immense and disproportionate amount of anger and hostility directed at me. Her altered personality is quite notable. The daughter reports that this is been present for a while but worse today.     Past Medical History  Diagnosis Date  . Hypertension   . Hyperlipidemia   . Stroke   . Peripheral vascular disease   . Atrial fibrillation     There are no active problems to display for this patient.   Past Surgical History  Procedure Laterality Date  . Abdominal hysterectomy      Current Outpatient Rx  Name  Route  Sig  Dispense  Refill  . acetaminophen (TYLENOL) 325 MG tablet   Oral   Take 650 mg by mouth every 4 (four) hours as needed for mild pain or fever.         Marland Kitchen amLODipine (NORVASC) 5 MG tablet   Oral   Take 5 mg by mouth daily.         Marland Kitchen atorvastatin (LIPITOR) 20 MG tablet   Oral   Take 20 mg by mouth daily.         . betamethasone dipropionate (DIPROLENE) 0.05 % cream   Topical   Apply 1 application topically every 12 (twelve) hours as needed (rash).         . Calcium Carb-Cholecalciferol (CALCIUM + D3) 600-200 MG-UNIT  TABS   Oral   Take 1 tablet by mouth daily.         . clopidogrel (PLAVIX) 75 MG tablet   Oral   Take 75 mg by mouth daily.         . diphenhydrAMINE (BENADRYL) 25 MG tablet   Oral   Take 25 mg by mouth every 6 (six) hours as needed for itching.         Marland Kitchen lisinopril (PRINIVIL,ZESTRIL) 20 MG tablet   Oral   Take 20 mg by mouth daily.         Marland Kitchen loratadine (CLARITIN) 10 MG tablet   Oral   Take 10 mg by mouth daily as needed for itching.         Marland Kitchen LORazepam (ATIVAN) 0.5 MG tablet   Oral   Take 0.5 mg by mouth every 8 (eight) hours as needed for anxiety.         . metoprolol tartrate (LOPRESSOR) 25 MG tablet   Oral   Take 25 mg by mouth 2 (two) times daily.         . Multiple Vitamins-Minerals (SENIOR MULTIVITAMIN PLUS) TABS   Oral   Take 1 tablet by mouth daily.         . QUEtiapine (SEROQUEL)  25 MG tablet   Oral   Take 25 mg by mouth daily.         . QUEtiapine (SEROQUEL) 50 MG tablet   Oral   Take 50 mg by mouth every evening.         . triamcinolone cream (KENALOG) 0.1 %   Topical   Apply 1 application topically every 12 (twelve) hours as needed (rash).           Allergies Banana  No family history on file.  Social History History  Substance Use Topics  . Smoking status: Never Smoker   . Smokeless tobacco: Not on file  . 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 Constitutional: Negative for fever. General weakness per nursing home staff ENT: Negative for sore throat. Cardiovascular: History of A. Fib. No acute issues noted Respiratory: Negative for shortness of breath. Gastrointestinal: Diarrhea. Genitourinary: Negative for dysuria. Musculoskeletal: No myalgias or injuries. Skin: Negative for rash. Neurological: Further alteration in patient's personality and demeanor. See history of present illness   10-point  ROS otherwise negative.  ____________________________________________   PHYSICAL EXAM:  VITAL SIGNS: ED Triage Vitals  Enc Vitals Group     BP 12/23/14 0933 126/77 mmHg     Pulse Rate 12/23/14 0933 112     Resp 12/23/14 0933 15     Temp 12/23/14 0933 98.3 F (36.8 C)     Temp Source 12/23/14 0933 Oral     SpO2 12/23/14 0933 98 %     Weight 12/23/14 0933 100 lb (45.36 kg)     Height 12/23/14 0933 $RemoveBefor'5\' 2"'itFOVRJiKgas$  (1.575 m)     Head Cir --      Peak Flow --      Pain Score --      Pain Loc --      Pain Edu? --      Excl. in [place]? --     Constitutional:  Alert, communicative, but angry, hateful. ENT   Head: Normocephalic and atraumatic.   Nose: No congestion/rhinnorhea.   Mouth/Throat: Mildly dry mucous membranes Cardiovascular: A. fib with mild tachycardia at 110 Respiratory:  Normal respiratory effort, no tachypnea.    Breath sounds are clear and equal bilaterally.  Gastrointestinal: Soft and nontender. No distention.  Back: No muscle spasm, no tenderness, no CVA tenderness. Musculoskeletal: Thin, frail elderly female with no deformity and no swelling.  Neurologic:  Inappropriate response to staff with great anger and lack of understanding. She does move all 4 extremities without deficit. No gross neurologic impairment other than that noted.  Skin:  Skin is warm, dry. No rash noted. Psychiatric: Patient is essentially snarling at medical staff and very angry and discontent. This is despite the best pleasant bedside manner that good be presented. She clearly has some degree of confusion. She demands an apology from May despite having just met me and they're not being any trespass.  The daughter does not respond to this abnormal behavior. The daughters free helpful and recognizes that this is simply abnormal but a variation of baseline for her mother. ____________________________________________    LABS (pertinent positives/negatives)   CBC is within normal limits Metabolic  panel shows an elevated sodium at 150 an elevated renal function with a BUN of 47 and creatinine of 1.6. Potassium is 3.4. Glucose is fairly normal at 116. Liver enzymes are normal.  ____________________________________________   EKG  ED  ECG REPORT I, Andy Allende W, the attending physician, personally viewed and interpreted this ECG.   Date: 12/23/2014  EKG Time: 9:40 AM  Rate: 110  Rhythm:Atrial fibrillation   with rapid ventricular rate   Axis: Left axis deviation  Intervals: Normal  ST&T Change: None noted  ____________________________________________   ____________________________________________   INITIAL IMPRESSION / ASSESSMENT AND PLAN / ED COURSE  Pertinent labs & imaging results that were available during my care of the patient were reviewed by me and considered in my medical decision making (see chart for details).   Patient with notable elevated sodium and renal function after having had diarrhea for a few days. We will admit her to the hospital for ongoing fluid resuscitation. I discussed medicating the patient with Valium to help the patient relax and be a little calmer and more manageable. The daughter agrees with this.  I have spoken with Dr. Posey Pronto, hospitalist, for evaluation for admission to the hospital. I've also spoken again with the daughter regarding her lab findings and reasons for admission. The daughters appreciative.  ____________________________________________   FINAL CLINICAL IMPRESSION(S) / ED DIAGNOSES  Final diagnoses:  Acute hypernatremia  Renal failure  Diarrhea  Dementia with aggressive behavior       Ahmed Prima, MD 12/23/14 1304

## 2014-12-23 NOTE — ED Notes (Signed)
Patient refusing lab work at this time.

## 2014-12-23 NOTE — Progress Notes (Signed)
ANTICOAGULATION CONSULT NOTE - Initial Consult  Pharmacy Consult for Lovenox/Heparin Indication: VTE prophylaxis  Allergies  Allergen Reactions  . Banana Nausea And Vomiting    Patient Measurements: Height: 5\' 2"  (157.5 cm) Weight: 100 lb (45.36 kg) IBW/kg (Calculated) : 50.1 Heparin Dosing Weight:   Vital Signs: Temp: 97.7 F (36.5 C) (06/22 1349) Temp Source: Oral (06/22 1349) BP: 130/71 mmHg (06/22 1349) Pulse Rate: 109 (06/22 1349)  Labs:  Recent Labs  12/23/14 1102  HGB 12.6  HCT 39.1  PLT 249  CREATININE 1.67*    Estimated Creatinine Clearance: 13.5 mL/min (by C-G formula based on Cr of 1.67).   Medical History: Past Medical History  Diagnosis Date  . Hypertension   . Hyperlipidemia   . Stroke   . Peripheral vascular disease   . Atrial fibrillation   . Anxiety     Medications:  Prescriptions prior to admission  Medication Sig Dispense Refill Last Dose  . acetaminophen (TYLENOL) 325 MG tablet Take 650 mg by mouth every 4 (four) hours as needed for mild pain or fever.   unknown  . amLODipine (NORVASC) 5 MG tablet Take 5 mg by mouth daily.   unknown  . atorvastatin (LIPITOR) 20 MG tablet Take 20 mg by mouth daily.   unknown  . betamethasone dipropionate (DIPROLENE) 0.05 % cream Apply 1 application topically every 12 (twelve) hours as needed (rash).   unknown  . Calcium Carb-Cholecalciferol (CALCIUM + D3) 600-200 MG-UNIT TABS Take 1 tablet by mouth daily.   unknown  . clopidogrel (PLAVIX) 75 MG tablet Take 75 mg by mouth daily.   unknown  . diphenhydrAMINE (BENADRYL) 25 MG tablet Take 25 mg by mouth every 6 (six) hours as needed for itching.   unknown  . lisinopril (PRINIVIL,ZESTRIL) 20 MG tablet Take 20 mg by mouth daily.   unknown  . loratadine (CLARITIN) 10 MG tablet Take 10 mg by mouth daily as needed for itching.   unknown  . LORazepam (ATIVAN) 0.5 MG tablet Take 0.5 mg by mouth every 8 (eight) hours as needed for anxiety.   unknown  . metoprolol  tartrate (LOPRESSOR) 25 MG tablet Take 25 mg by mouth 2 (two) times daily.   unknown  . Multiple Vitamins-Minerals (SENIOR MULTIVITAMIN PLUS) TABS Take 1 tablet by mouth daily.   unknown  . QUEtiapine (SEROQUEL) 25 MG tablet Take 25 mg by mouth daily.   unknown  . QUEtiapine (SEROQUEL) 50 MG tablet Take 50 mg by mouth every evening.   unknown  . triamcinolone cream (KENALOG) 0.1 % Apply 1 application topically every 12 (twelve) hours as needed (rash).   unknown    Assessment: CrCl = 13.5 ml/min Goal of Therapy:  DVT prophylaxis   Plan:  Lovenox 40 mg SQ Q24H originally ordered.  Will d/c lovenox and order Heparin 5000 units SQ Q12H based on CrCl < 15 ml/min.   Bridget Livingston D 12/23/2014,2:18 PM

## 2014-12-23 NOTE — H&P (Addendum)
[facility] - [place] at [facility]   PATIENT NAME: Bridget Livingston    MR#:  [id number]  DATE OF BIRTH:  1914-12-10  DATE OF ADMISSION:  12/23/2014  PRIMARY CARE PHYSICIAN: No primary care provider on file.   REQUESTING/REFERRING PHYSICIAN: Janalyn Harder  CHIEF COMPLAINT:   Chief Complaint  Patient presents with  . Weakness    HISTORY OF PRESENT ILLNESS: Bridget Livingston  is a 81 y.o. female with a known history of hypertension hyperlipidemia, CVA, peripheral vascular disease, atrial fibrillation and general anxiety disorder. Who is brought into the emergency room with complaint of having diarrhea for the past 5 days. And has had progressive weakness. Her daughters the bedside who provides all the history. Patient is very agitated and refuses to answer any questions. Daughter reports that the diarrhea started about 5 days ago but is trending downwards she has had multiple liquidy bowel movements without any blood. She is not sure of any recent anabiotic usage. Patient did not complain of any abdominal pain. She has not been eating or drinking much and by mouth intake is very poor.  PAST MEDICAL HISTORY:   Past Medical History  Diagnosis Date  . Hypertension   . Hyperlipidemia   . Stroke   . Peripheral vascular disease   . Atrial fibrillation   . Anxiety     PAST SURGICAL HISTORY:  Past Surgical History  Procedure Laterality Date  . Abdominal hysterectomy      SOCIAL HISTORY:  History  Substance Use Topics  . Smoking status: Never Smoker   . Smokeless tobacco: Not on file  . Alcohol Use: No    FAMILY HISTORY:  Family History  Problem Relation Age of Onset  . Hypertension Mother     DRUG ALLERGIES:  Allergies  Allergen Reactions  . Banana Nausea And Vomiting    REVIEW OF SYSTEMS: Refuses to talk to me and able to get any review of systems  MEDICATIONS AT HOME:  Prior to Admission medications   Medication Sig Start Date End Date  Taking? Authorizing Provider  acetaminophen (TYLENOL) 325 MG tablet Take 650 mg by mouth every 4 (four) hours as needed for mild pain or fever.   Yes Historical Provider, MD  amLODipine (NORVASC) 5 MG tablet Take 5 mg by mouth daily.   Yes Historical Provider, MD  atorvastatin (LIPITOR) 20 MG tablet Take 20 mg by mouth daily.   Yes Historical Provider, MD  betamethasone dipropionate (DIPROLENE) 0.05 % cream Apply 1 application topically every 12 (twelve) hours as needed (rash).   Yes Historical Provider, MD  Calcium Carb-Cholecalciferol (CALCIUM + D3) 600-200 MG-UNIT TABS Take 1 tablet by mouth daily.   Yes Historical Provider, MD  clopidogrel (PLAVIX) 75 MG tablet Take 75 mg by mouth daily.   Yes Historical Provider, MD  diphenhydrAMINE (BENADRYL) 25 MG tablet Take 25 mg by mouth every 6 (six) hours as needed for itching.   Yes Historical Provider, MD  lisinopril (PRINIVIL,ZESTRIL) 20 MG tablet Take 20 mg by mouth daily.   Yes Historical Provider, MD  loratadine (CLARITIN) 10 MG tablet Take 10 mg by mouth daily as needed for itching.   Yes Historical Provider, MD  LORazepam (ATIVAN) 0.5 MG tablet Take 0.5 mg by mouth every 8 (eight) hours as needed for anxiety.   Yes Historical Provider, MD  metoprolol tartrate (LOPRESSOR) 25 MG tablet Take 25 mg by mouth 2 (two) times daily.   Yes Historical Provider, MD  Multiple Vitamins-Minerals (SENIOR MULTIVITAMIN  PLUS) TABS Take 1 tablet by mouth daily.   Yes Historical Provider, MD  QUEtiapine (SEROQUEL) 25 MG tablet Take 25 mg by mouth daily.   Yes Historical Provider, MD  QUEtiapine (SEROQUEL) 50 MG tablet Take 50 mg by mouth every evening.   Yes Historical Provider, MD  triamcinolone cream (KENALOG) 0.1 % Apply 1 application topically every 12 (twelve) hours as needed (rash).   Yes Historical Provider, MD      PHYSICAL EXAMINATION:   VITAL SIGNS: Blood pressure 127/71, pulse 107, temperature 98.3 F (36.8 C), temperature source Oral, resp. rate 36,  height  (1.575 m), weight 45.36 kg (100 lb), SpO2 99 %.  GENERAL:  81 y.o.-year-old patient lying in the bed with no acute distress.  EYES: Pupils equal, round, reactive to light and accommodation. No scleral icterus. Extraocular muscles intact.  HEENT: Head atraumatic, normocephalic. Oropharynx dry and nasopharynx clear.  NECK:  Supple, no jugular venous distention. No thyroid enlargement, no tenderness.  LUNGS: Normal breath sounds bilaterally, no wheezing, rales,rhonchi or crepitation. No use of accessory muscles of respiration.  CARDIOVASCULAR: S1, S2 normal. No murmurs, rubs, or gallops.  ABDOMEN: Soft, nontender, nondistended. Bowel sounds present. No organomegaly or mass.  EXTREMITIES: No pedal edema, cyanosis, or clubbing.  NEUROLOGIC: Cranial nerves II through XII are intact. Muscle strength 5/5 in all extremities. Sensation intact. Gait not checked.  PSYCHIATRIC: The patient is alert and oriented x 3.  SKIN: No obvious rash, lesion, or ulcer.   LABORATORY PANEL:   CBC  Recent Labs Lab 12/23/14 1102  WBC 9.5  HGB 12.6  HCT 39.1  PLT 249  MCV 89.0  MCH 28.7  MCHC 32.2  RDW 15.1*   ------------------------------------------------------------------------------------------------------------------  Chemistries   Recent Labs Lab 12/23/14 1102  NA 150*  K 3.4*  CL 114*  CO2 22  GLUCOSE 116*  BUN 47*  CREATININE 1.67*  CALCIUM 9.2  AST 24  ALT 19  ALKPHOS 66  BILITOT 0.7   ------------------------------------------------------------------------------------------------------------------ estimated creatinine clearance is 13.5 mL/min (by C-G formula based on Cr of 1.67). ------------------------------------------------------------------------------------------------------------------ No results for input(s): TSH, T4TOTAL, T3FREE, THYROIDAB in the last 72 hours.  Invalid input(s): FREET3   Coagulation profile No results for input(s): INR, PROTIME in the  last 168 hours. ------------------------------------------------------------------------------------------------------------------- No results for input(s): DDIMER in the last 72 hours. -------------------------------------------------------------------------------------------------------------------  Cardiac Enzymes No results for input(s): CKMB, TROPONINI, MYOGLOBIN in the last 168 hours.  Invalid input(s): CK ------------------------------------------------------------------------------------------------------------------ Invalid input(s): POCBNP  ---------------------------------------------------------------------------------------------------------------  Urinalysis    Component Value Date/Time   COLORURINE AMBER* 12/23/2014 1103   APPEARANCEUR HAZY* 12/23/2014 1103   LABSPEC 1.023 12/23/2014 1103   PHURINE 5.0 12/23/2014 1103   GLUCOSEU NEGATIVE 12/23/2014 1103   HGBUR NEGATIVE 12/23/2014 1103   BILIRUBINUR NEGATIVE 12/23/2014 1103   KETONESUR TRACE* 12/23/2014 1103   PROTEINUR 30* 12/23/2014 1103   NITRITE NEGATIVE 12/23/2014 1103   LEUKOCYTESUR 1+* 12/23/2014 1103     RADIOLOGY: No results found.  EKG: Orders placed or performed in visit on 06/02/13  . EKG 12-Lead    IMPRESSION AND PLAN: Patient is a 81 year old white female with 5 days history of diarrhea 1. Diarrhea possible viral in nature has not improved however we will get stool cultures and C. Difficile. 2. Acute renal failure will give her IV fluids monitor renal function hold her lisinopril 3. Hyponatremia: Due to poor by mouth intake and dehydration, patient will be on D5 containing fluids.  4. Anxiety anxiety disorder: Continue Seroquel 5. Hyperlipidemia  continue atorvastatin 6. Hypertension: Hold lisinopril continue metoprolol     All the records are reviewed and case discussed with ED provider. Management plans discussed with the patient, family and they are in agreement.  CODE  STATUS: Advance Directive Documentation        Most Recent Value   Type of Advance Directive  Healthcare Power of Attorney   Pre-existing out of facility DNR order (yellow form or pink MOST form)     "MOST" Form in Place?         TOTAL TIME TAKING CARE OF THIS PATIENT:55 minutes.    Auburn Bilberry M.D on 12/23/2014 at 1:07 PM  Between 7am to 6pm - Pager - [phone]  After 6pm go to [email or web address] - password [email or web address] [facility]  [place] [facility]  Office  [phone]  CC: Primary care physician; No primary care provider on file.

## 2014-12-23 NOTE — ED Notes (Signed)
Pt arrives from [facility], daughter at bedside stating pt has had diaherra for a few days and a change in behavior, upon assessment pt knows name but states "my daughter is the reason I am here"

## 2014-12-23 NOTE — Plan of Care (Signed)
Problem: Discharge Progression Outcomes Goal: Discharge plan in place and appropriate Individualization of Care:   Patient prefers to be called Bridget Livingston. Patient is from [facility]. Has a history of hypertension, hyperlipidemia, CVA, peripheral vascular disease, atrial fibrillation and general anxiety disorder - controlled by home medications. Daughter is POA.

## 2014-12-23 NOTE — ED Notes (Signed)
Patient from [facility]. Patient has had diarrhea since Thursday. [facility] staff reports poor PO intake. Patient exhibiting weakness, difficulty ambulating. Staff also reports increased confusion.

## 2014-12-23 NOTE — Plan of Care (Signed)
Problem: Discharge Progression Outcomes Goal: Other Discharge Outcomes/Goals Plan of care progress to goal:  Patient is confused and anxious. IV fluids infusing as ordered. Daughter at bedside. Patient refusing to take PO medications at this time, stating "yall are trying to trick me." Isolation to rule out C. Diff. Need stool specimen - no bowel movement since admission. PO intake is minimal - encouragement given.

## 2014-12-24 DIAGNOSIS — Z79899 Other long term (current) drug therapy: Secondary | ICD-10-CM

## 2014-12-24 DIAGNOSIS — R197 Diarrhea, unspecified: Secondary | ICD-10-CM

## 2014-12-24 DIAGNOSIS — Z90710 Acquired absence of both cervix and uterus: Secondary | ICD-10-CM

## 2014-12-24 DIAGNOSIS — R5383 Other fatigue: Secondary | ICD-10-CM

## 2014-12-24 DIAGNOSIS — Z8673 Personal history of transient ischemic attack (TIA), and cerebral infarction without residual deficits: Secondary | ICD-10-CM

## 2014-12-24 DIAGNOSIS — Z515 Encounter for palliative care: Secondary | ICD-10-CM

## 2014-12-24 DIAGNOSIS — F419 Anxiety disorder, unspecified: Secondary | ICD-10-CM

## 2014-12-24 DIAGNOSIS — E785 Hyperlipidemia, unspecified: Secondary | ICD-10-CM

## 2014-12-24 DIAGNOSIS — R410 Disorientation, unspecified: Secondary | ICD-10-CM

## 2014-12-24 DIAGNOSIS — I4891 Unspecified atrial fibrillation: Secondary | ICD-10-CM

## 2014-12-24 DIAGNOSIS — I739 Peripheral vascular disease, unspecified: Secondary | ICD-10-CM

## 2014-12-24 DIAGNOSIS — N179 Acute kidney failure, unspecified: Principal | ICD-10-CM

## 2014-12-24 DIAGNOSIS — N19 Unspecified kidney failure: Secondary | ICD-10-CM | POA: Insufficient documentation

## 2014-12-24 LAB — CBC
HCT: 32.7 % — ABNORMAL LOW (ref 35.0–47.0)
Hemoglobin: 10.8 g/dL — ABNORMAL LOW (ref 12.0–16.0)
MCH: 29.2 pg (ref 26.0–34.0)
MCHC: 33.1 g/dL (ref 32.0–36.0)
MCV: 88.2 fL (ref 80.0–100.0)
PLATELETS: 220 10*3/uL (ref 150–440)
RBC: 3.71 MIL/uL — AB (ref 3.80–5.20)
RDW: 15.1 % — ABNORMAL HIGH (ref 11.5–14.5)
WBC: 7.3 10*3/uL (ref 3.6–11.0)

## 2014-12-24 LAB — BASIC METABOLIC PANEL
Anion gap: 6 (ref 5–15)
BUN: 35 mg/dL — AB (ref 6–20)
CHLORIDE: 116 mmol/L — AB (ref 101–111)
CO2: 20 mmol/L — ABNORMAL LOW (ref 22–32)
CREATININE: 1.26 mg/dL — AB (ref 0.44–1.00)
Calcium: 7.9 mg/dL — ABNORMAL LOW (ref 8.9–10.3)
GFR calc non Af Amer: 34 mL/min — ABNORMAL LOW (ref >60)
GFR, EST AFRICAN AMERICAN: 40 mL/min — AB (ref >60)
GLUCOSE: 108 mg/dL — AB (ref 65–99)
Potassium: 3 mmol/L — ABNORMAL LOW (ref 3.5–5.1)
Sodium: 142 mmol/L (ref 135–145)

## 2014-12-24 LAB — C DIFFICILE QUICK SCREEN W PCR REFLEX
C DIFFICILE (CDIFF) TOXIN: NEGATIVE
C Diff antigen: NEGATIVE
C Diff interpretation: NEGATIVE

## 2014-12-24 LAB — MAGNESIUM: MAGNESIUM: 1.9 mg/dL (ref 1.7–2.4)

## 2014-12-24 MED ORDER — CEFTRIAXONE SODIUM IN DEXTROSE 20 MG/ML IV SOLN
1.0000 g | INTRAVENOUS | Status: DC
  Administered 2014-12-24 – 2014-12-26 (×3): 1 g via INTRAVENOUS
  Filled 2014-12-24 (×4): qty 50

## 2014-12-24 MED ORDER — ENSURE ENLIVE PO LIQD
237.0000 mL | Freq: Two times a day (BID) | ORAL | Status: DC
  Administered 2014-12-25 (×2): 237 mL via ORAL

## 2014-12-24 MED ORDER — POTASSIUM CHLORIDE CRYS ER 20 MEQ PO TBCR
40.0000 meq | EXTENDED_RELEASE_TABLET | Freq: Once | ORAL | Status: AC
  Administered 2014-12-24: 40 meq via ORAL
  Filled 2014-12-24: qty 2

## 2014-12-24 NOTE — Plan of Care (Signed)
Problem: Discharge Progression Outcomes Goal: Other Discharge Outcomes/Goals Plan of care progress to goal for: Diarrhea - No complaints of pain. - IV fluids continued. - Incontinent of urine, no BM this shift. - Anxious at times. - Will continue to  Monitor.

## 2014-12-24 NOTE — Progress Notes (Addendum)
[facility] Physicians - [place] at [facility]   PATIENT NAME: Bridget Livingston    MR#:  [id number]  DATE OF BIRTH:  January 28, 1915  SUBJECTIVE:  CHIEF COMPLAINT:   Chief Complaint  Patient presents with  . Weakness   the patient is sleeping unable to wake up.  REVIEW OF SYSTEMS:  Unable to get review of systems due to sleep.  DRUG ALLERGIES:   Allergies  Allergen Reactions  . Banana Nausea And Vomiting    VITALS:  Blood pressure 119/60, pulse 96, temperature 97.3 F (36.3 C), temperature source Oral, resp. rate 20, height 5\' 2"  (1.575 m), weight 52.759 kg (116 lb 5 oz), SpO2 97 %.  PHYSICAL EXAMINATION:  GENERAL:  81 y.o.-year-old patient lying in the bed with no acute distress.  EYES: unable to exam. HEENT: Head atraumatic, normocephalic.  NECK:  Supple, no jugular venous distention. No thyroid enlargement, no tenderness.  LUNGS: Normal breath sounds bilaterally, no wheezing, rales,rhonchi or crepitation. No use of accessory muscles of respiration.  CARDIOVASCULAR: S1, S2 normal. No murmurs, rubs, or gallops.  ABDOMEN: Soft, nontender, nondistended. Bowel sounds present. No organomegaly or mass.  EXTREMITIES: No pedal edema, cyanosis, or clubbing.  NEUROLOGIC: Unable to exam.  PSYCHIATRIC: The patient is sleeping and unable to wake up. SKIN: No obvious rash, lesion, or ulcer.    LABORATORY PANEL:   CBC  Recent Labs Lab 12/24/14 0507  WBC 7.3  HGB 10.8*  HCT 32.7*  PLT 220   ------------------------------------------------------------------------------------------------------------------  Chemistries   Recent Labs Lab 12/23/14 1102 12/24/14 0507  NA 150* 142  K 3.4* 3.0*  CL 114* 116*  CO2 22 20*  GLUCOSE 116* 108*  BUN 47* 35*  CREATININE 1.67* 1.26*  CALCIUM 9.2 7.9*  MG  --  1.9  AST 24  --   ALT 19  --   ALKPHOS 66  --   BILITOT 0.7  --     ------------------------------------------------------------------------------------------------------------------  Cardiac Enzymes No results for input(s): TROPONINI in the last 168 hours. ------------------------------------------------------------------------------------------------------------------  RADIOLOGY:  No results found.  EKG:   Orders placed or performed in visit on 06/02/13  . EKG 12-Lead    ASSESSMENT AND PLAN:    1. Diarrhea. Resolved.  possible viral in nature, negative stool cultures and C. Difficile.  * UTI: Follow-up urine culture and start Rocephin.  2. Acute renal failure. Improving,  continue IV fluid, follow-up BMP and hold lisinopril.  * Hypokalemia. give potassium supplement and follow-up BMP. Magnesium level is normal.  3. Hypernatremia: Improved.  4. Anxiety anxiety disorder: Continue Seroquel 5. Hyperlipidemia continue atorvastatin 6. Hypertension: Hold lisinopril continue metoprolol and norvasc.     All the records are reviewed and case discussed with Care Management/Social Workerr. Management plans discussed with the patient's daughter and she is in agreement.  CODE STATUS: Full code I discussed the patient condition,  the plan of treatment and call the status with the patient's daughter, who is her POA. She would like to discuss with palliative care physician about CODE STATUS.  TOTAL TIME TAKING CARE OF THIS PATIENT: 43 minutes.   POSSIBLE D/C IN 2-3 DAYS, DEPENDING ON CLINICAL CONDITION.   Shaune Pollack M.D on 12/24/2014 at 11:37 AM  Between 7am to 6pm - Pager - [phone]  After 6pm go to [email or web address] - password [email or web address] [facility]  [place] [facility]  Office  [phone]  CC: Primary care physician; No primary care provider on file.

## 2014-12-24 NOTE — Consult Note (Signed)
Palliative Medicine Inpatient Consult Note   Name: Bridget Livingston Date: 12/24/2014 MRN: [id number]  DOB: 1915/04/23  Referring Physician: Shaune Pollack, MD  Palliative Care consult requested for this 81 y.o. female for goals of medical therapy in patient with PVD, CVA,  afib, and HLD admitted with acute renal failure and diarrhea.  [name] is a 81 year old female with a PMH of  CVA (2 years ago), PVD, afib, HTN, HLD, and s/p abdominal hysterectomy.  Pt presented to ER from [facility] with diarrhea, weakness and confusion.  ER workup showed dehydration, acute renal failure and a-fib with RVR.  Pt admitted for evaluation and treatment.  Pt currently sleeping in bed.  Family not at bedside.   REVIEW OF SYSTEMS:  Patient is not able to provide ROS   SOCIAL HISTORY: Pt currently resides at [facility].  Pt's daughter, Bridget Livingston is HCPOA.  Pt has one more daughter, Bridget Livingston.  reports that she has never smoked. She does not have any smokeless tobacco history on file. She reports that she does not drink alcohol.  LEGAL DOCUMENTS:  Health Care Power of Attorney:  Yes.  CODE STATUS: Full code  PAST MEDICAL HISTORY: Past Medical History  Diagnosis Date  . Hypertension   . Hyperlipidemia   . Stroke   . Peripheral vascular disease   . Atrial fibrillation   . Anxiety     PAST SURGICAL HISTORY:  Past Surgical History  Procedure Laterality Date  . Abdominal hysterectomy      ALLERGIES:  is allergic to banana.  MEDICATIONS:  Current Facility-Administered Medications  Medication Dose Route Frequency Provider Last Rate Last Dose  . acetaminophen (TYLENOL) tablet 650 mg  650 mg Oral Q6H PRN Auburn Bilberry, MD       Or  . acetaminophen (TYLENOL) suppository 650 mg  650 mg Rectal Q6H PRN Auburn Bilberry, MD      . amLODipine (NORVASC) tablet 5 mg  5 mg Oral Daily Auburn Bilberry, MD   5 mg at 12/24/14 0747  . antiseptic oral rinse (CPC / CETYLPYRIDINIUM CHLORIDE 0.05%) solution  7 mL  7 mL Mouth Rinse BID Auburn Bilberry, MD   7 mL at 12/23/14 2322  . atorvastatin (LIPITOR) tablet 20 mg  20 mg Oral Daily Auburn Bilberry, MD   20 mg at 12/24/14 0747  . calcium-vitamin D (OSCAL WITH D) 500-200 MG-UNIT per tablet 1 tablet  1 tablet Oral Daily Auburn Bilberry, MD   1 tablet at 12/24/14 0747  . cefTRIAXone (ROCEPHIN) 1 g in dextrose 5 % 50 mL IVPB - Premix  1 g Intravenous Q24H Shaune Pollack, MD      . clobetasol cream (TEMOVATE) 0.05 % 1 application  1 application Topical Q12H PRN Auburn Bilberry, MD      . clopidogrel (PLAVIX) tablet 75 mg  75 mg Oral Daily Auburn Bilberry, MD   75 mg at 12/24/14 0747  . dextrose 5 % with KCl 20 mEq / L  infusion  20 mEq Intravenous Continuous Auburn Bilberry, MD 100 mL/hr at 12/24/14 0702 20 mEq at 12/24/14 0702  . diphenhydrAMINE (BENADRYL) tablet 25 mg  25 mg Oral Q6H PRN Auburn Bilberry, MD      . feeding supplement (ENSURE ENLIVE) (ENSURE ENLIVE) liquid 237 mL  237 mL Oral BID BM Shaune Pollack, MD      . heparin injection 5,000 Units  5,000 Units Subcutaneous Q12H Auburn Bilberry, MD   5,000 Units at 12/23/14 2318  . loratadine (  CLARITIN) tablet 10 mg  10 mg Oral Daily PRN Auburn Bilberry, MD      . LORazepam (ATIVAN) tablet 0.5 mg  0.5 mg Oral Q8H PRN Auburn Bilberry, MD   0.5 mg at 12/24/14 0747  . metoprolol tartrate (LOPRESSOR) tablet 25 mg  25 mg Oral BID Auburn Bilberry, MD   25 mg at 12/24/14 0747  . multivitamin with minerals tablet 1 tablet  1 tablet Oral Daily Auburn Bilberry, MD   1 tablet at 12/24/14 0747  . ondansetron (ZOFRAN) tablet 4 mg  4 mg Oral Q6H PRN Auburn Bilberry, MD       Or  . ondansetron (ZOFRAN) injection 4 mg  4 mg Intravenous Q6H PRN Auburn Bilberry, MD      . QUEtiapine (SEROQUEL) tablet 25 mg  25 mg Oral Daily Auburn Bilberry, MD   25 mg at 12/24/14 0748  . QUEtiapine (SEROQUEL) tablet 50 mg  50 mg Oral QPM Auburn Bilberry, MD   50 mg at 12/23/14 1800  . triamcinolone ointment (KENALOG) 0.1 %   Topical Q12H PRN Auburn Bilberry,  MD        Vital Signs: BP 119/60 mmHg  Pulse 96  Temp(Src) 97.3 F (36.3 C) (Oral)  Resp 20  Ht  (1.575 m)  Wt 52.759 kg (116 lb 5 oz)  BMI 21.27 kg/m2  SpO2 97% Filed Weights   12/23/14 0933 12/24/14 0950  Weight: 45.36 kg (100 lb) 52.759 kg (116 lb 5 oz)    Estimated body mass index is 21.27 kg/(m^2) as calculated from the following:   Height as of this encounter:  (1.575 m).   Weight as of this encounter: 52.759 kg (116 lb 5 oz).  PERFORMANCE STATUS (ECOG) : 1 - Symptomatic but completely ambulatory  PHYSICAL EXAM: [place]: NAD HEENT: OP clear, dry oral mucosa Neck: Trachea midline  Cardiovascular: RRR Pulmonary/Chest: Clear ant fields Abdominal: Soft, NTTP. Hypoactive bowel sounds GU: No SP tenderness, No CVA tenderness Extremities: No edema  Neurological: Grossly nonfocal Skin: Warm, dry and intact.  Psychiatric: lethargic  LABS: CBC:    Component Value Date/Time   WBC 7.3 12/24/2014 0507   WBC 10.3 06/02/2013 2004   HGB 10.8* 12/24/2014 0507   HGB 12.8 06/02/2013 2004   HCT 32.7* 12/24/2014 0507   HCT 37.3 06/02/2013 2004   PLT 220 12/24/2014 0507   PLT 383 06/02/2013 2004   MCV 88.2 12/24/2014 0507   MCV 88 06/02/2013 2004   NEUTROABS 9.6* 12/05/2012 0222   LYMPHSABS 0.8* 12/05/2012 0222   MONOABS 1.1* 12/05/2012 0222   EOSABS 0.0 12/05/2012 0222   BASOSABS 0.0 12/05/2012 0222   Comprehensive Metabolic Panel:    Component Value Date/Time   NA 142 12/24/2014 0507   NA 133* 06/02/2013 2004   K 3.0* 12/24/2014 0507   K 4.1 06/02/2013 2004   CL 116* 12/24/2014 0507   CL 101 06/02/2013 2004   CO2 20* 12/24/2014 0507   CO2 26 06/02/2013 2004   BUN 35* 12/24/2014 0507   BUN 22* 06/02/2013 2004   CREATININE 1.26* 12/24/2014 0507   CREATININE 0.77 06/02/2013 2004   GLUCOSE 108* 12/24/2014 0507   GLUCOSE 95 06/02/2013 2004   CALCIUM 7.9* 12/24/2014 0507   CALCIUM 9.0 06/02/2013 2004   AST 24 12/23/2014 1102   AST 37 12/05/2012 0222    ALT 19 12/23/2014 1102   ALT 30 12/05/2012 0222   ALKPHOS 66 12/23/2014 1102   ALKPHOS 66 12/05/2012 0222   BILITOT  0.7 12/23/2014 1102   PROT 7.6 12/23/2014 1102   PROT 6.7 12/05/2012 0222   ALBUMIN 3.3* 12/23/2014 1102   ALBUMIN 3.3* 12/05/2012 0222    IMPRESSION:  Ms. Ransier is a 81 year old female with a PMH of  CVA (2 years ago), PVD, afib, HTN, HLD, and s/p abdominal hysterectomy.  Pt presented to ER from [facility]F with diarrhea, weakness and confusion.  ER workup showed dehydration, acute renal failure and a-fib with RVR.  Pt admitted for evaluation and treatment.  Pt currently sleeping in bed.  Family not at bedside.  Pt resting in bed comfortably.  Pt awakes briefly when stimulated and falls back asleep.  Daughter contacted by phone and states pt at baseline walks with walker, feeds, bathe's and dresses self.  Daughter states pt is an irritable person and when she wakes up to watch out.  Daughter, Avon Gully, confirms that pt is a DNR and would like for me to change this.  PLAN: 1. DNR     More than 50% of the visit was spent in counseling/coordination of care: Yes  Time Spent: 

## 2014-12-24 NOTE — Plan of Care (Signed)
Problem: Discharge Progression Outcomes Goal: Tolerating diet Outcome: Not Progressing PO intake minimal. Patient refused ensure. Goal: Other Discharge Outcomes/Goals Plan of care progress to goal:  No complaints of pain. IV fluids infusing as ordered. Incontinent of urine and stool. PO Ativan given for anxiety, dose was effective. Daughter at bedside. Palliative consulted - code status changed to DNR. PO intake remains minimal - encouragement given by nursing staff and daughter.

## 2014-12-24 NOTE — Clinical Social Work Note (Signed)
Clinical Social Work Assessment  Patient Details  Name: Bridget Livingston MRN: [id number] Date of Birth: 1915/04/14  Date of referral:  12/24/14               Reason for consult:  Facility Placement                Permission sought to share information with:  Family Supports Permission granted to share information::  No  Name::      Kerin Ransom, daughter, HCPOA)   Housing/Transportation Living arrangements for the past 2 months:  [place] of Information:  Adult Children Patient Interpreter Needed:  None Criminal Activity/Legal Involvement Pertinent to Current Situation/Hospitalization:  No - Comment as needed Significant Relationships:  Adult Children Lives with:  Facility Resident Do you feel safe going back to the place where you live?  Yes Need for family participation in patient care:  No (Coment)  Care giving concerns:  No care giving concerns identified by pt's daughter.    Social Worker assessment / plan:  CSW met with pt's daughter, Kerin Ransom [facility]), to address consult. CSW introduced herself and explained role of social work. CSW also explained process of returning to ALF. Pt is a LTC resident of [facility] at the ALF level of care. Pt has been there two years. Pt's daughter would like her to return to facility at discharge.   PT eval is pending. CSW completed FL2 and placed on the chart for MD's signature. CSW will continue to follow.   Employment status:  Retired Forensic scientist:  Medicare PT Recommendations:  Not assessed at this time [place] / Referral to community resources:  Other (Comment Required) (Pt is a resident of a facility)  Patient/Family's Response to care:  Pt's daughter was very pleasant and appreciative of CSW support.   Patient/Family's Understanding of and Emotional Response to Diagnosis, Current Treatment, and Prognosis:  Pt's daughter would like for pt to return to [facility] for continued care.   Emotional  Assessment Appearance:  Appears stated age Attitude/Demeanor/Rapport:  Sedated (Pt has been very sleepy all morning. ) Affect (typically observed):  Unable to Assess Orientation:  Fluctuating Orientation (Suspected and/or reported Sundowners) Alcohol / Substance use:  Never Used Psych involvement (Current and /or in the community):  No (Comment)  Discharge Needs  Concerns to be addressed:  No discharge needs identified Readmission within the last 30 days:  No Current discharge risk:  None Barriers to Discharge:  No Barriers Identified   Darden Dates, LCSW 12/24/2014, 2:44 PM

## 2014-12-24 NOTE — Progress Notes (Signed)
Initial Nutrition Assessment  INTERVENTION: Meals and Snacks: Cater to patient preferences Medical Food Supplement Therapy: will recommend sending Ensure Enlive (each supplement provides 350kcal and 20 grams of protein) BID for added nutrition  NUTRITION DIAGNOSIS:  Inadequate oral intake related to acute illness as evidenced by meal completion < 25%.  GOAL:  Patient will meet greater than or equal to 90% of their needs  MONITOR:   (Energy Intake, Electrolyte and renal Profile, Digestive System,)  REASON FOR ASSESSMENT:  Malnutrition Screening Tool    ASSESSMENT:  Pt admitted with weakness, diarrhea, acute renal failure and decreased po intake. Per MD note, c.diff negative and diarrhea currently resolved. Pt confused and agitated on admission. Pt s/p ativan dose this am, asleep on visit. PMHx:  Past Medical History  Diagnosis Date  . Hypertension   . Hyperlipidemia   . Stroke   . Peripheral vascular disease   . Atrial fibrillation   . Anxiety    Diet Order: Regular  Current Nutrition: Pt ate 0% this am, asleep. No other po intake documented in I/O chart.   Food/Nutrition-Related History: Per H&P pt with decreased po intake PTA, unable to clarify further with pt on visit. No family present.    Medications: Ativan, MVI, Calcium-vitamin D, D5 with KCl at 167mL/hr (providing 408kcals in 24 hours)  Electrolyte/Renal Profile and Glucose Profile:   Recent Labs Lab 12/23/14 1102 12/24/14 0507  NA 150* 142  K 3.4* 3.0*  CL 114* 116*  CO2 22 20*  BUN 47* 35*  CREATININE 1.67* 1.26*  CALCIUM 9.2 7.9*  MG  --  1.9  GLUCOSE 116* 108*   Protein Profile:  Recent Labs Lab 12/23/14 1102  ALBUMIN 3.3*    Gastrointestinal Profile: Last BM: 6/23   Nutrition-Focused Physical Exam Findings:  Unable to complete Nutrition-Focused physical exam at this time.   Weight Change: Per MST pt with weight loss PTA, no further weights in CHL Anthropometrics: Height:  Ht  Readings from Last 1 Encounters:  12/23/14 5\' 2"  (1.575 m)   Weight:  Wt Readings from Last 1 Encounters:  12/24/14 116 lb 5 oz (52.759 kg)    Wt Readings from Last 10 Encounters:  12/24/14 116 lb 5 oz (52.759 kg)    BMI:  Body mass index is 21.27 kg/(m^2).   Estimated Nutritional Needs:  Kcal:  1130-1335kcals, BEE: 856kcals, TEE: (IF 1.1-1.3)(AF 1.2)   Protein:  52-63g protein (1.0-1.2g/kg)  Fluid:  1308-1540mL of fluid (25-7mL/kg)  Skin:  Reviewed, no issues  Diet Order:  Diet regular Room service appropriate?: Yes; Fluid consistency:: Thin  EDUCATION NEEDS:  Education needs no appropriate at this time   Intake/Output Summary (Last 24 hours) at 12/24/14 1359 Last data filed at 12/24/14 0702  Gross per 24 hour  Intake    350 ml  Output      0 ml  Net    350 ml    HIGH Care Level  Leda Quail, RD, LDN Pager [phone]

## 2014-12-24 NOTE — Progress Notes (Signed)
PT Cancellation Note  Patient Details Name: Bridget Livingston MRN: [id number] DOB: [birth year]   Cancelled Treatment:    Reason Eval/Treat Not Completed: Other (comment) (See PT note for further information. ) PT attempted evaluation this afternoon, but pt was not able to wake up enough to fully participate in evaluation. She was asked if tomorrow would be a better time and she agreed. PT will make attempt at evaluation tomorrow AM.    Benna Dunks 12/24/2014, 1:29 PM  Benna Dunks, SPT. [phone]

## 2014-12-25 LAB — BASIC METABOLIC PANEL
ANION GAP: 3 — AB (ref 5–15)
BUN: 26 mg/dL — ABNORMAL HIGH (ref 6–20)
CHLORIDE: 113 mmol/L — AB (ref 101–111)
CO2: 20 mmol/L — ABNORMAL LOW (ref 22–32)
Calcium: 7.5 mg/dL — ABNORMAL LOW (ref 8.9–10.3)
Creatinine, Ser: 1.08 mg/dL — ABNORMAL HIGH (ref 0.44–1.00)
GFR calc Af Amer: 48 mL/min — ABNORMAL LOW (ref >60)
GFR calc non Af Amer: 41 mL/min — ABNORMAL LOW (ref >60)
GLUCOSE: 111 mg/dL — AB (ref 65–99)
Potassium: 4.3 mmol/L (ref 3.5–5.1)
SODIUM: 136 mmol/L (ref 135–145)

## 2014-12-25 NOTE — Evaluation (Signed)
Physical Therapy Evaluation Patient Details Name: Bridget Livingston MRN: [id number] DOB: October 27, 1914 Today's Date: 12/25/2014   History of Present Illness  Pt is a 81 year old female who was admitted to the hospital for diarrhea over the course of the past few days.   Clinical Impression  Pt evaluated by PT this morning is a 81 year-old female with history of HTN, CVA, PVD, A-fib, and dementia. Examination revealed global weakness, particularly in the LE. Pt performs bed mobility and transfers with max assist +2 for lifting support and RW. Pt not able to ambulate secondary to weakness and cognitive impairments. Although pt requires simple cues for completion of task, she attended to task much better today than yesterday. Primary physical deficits include generalized weakness and cognitive impairments. Pt will benefit from skilled PT in order to build functional strength and return her to her premorbid state of being modified independent with transfers and ambulation.  PT recommendation was discussed with pt's daughter and she agreed with the recommendation.     Follow Up Recommendations SNF    Equipment Recommendations       Recommendations for Other Services       Precautions / Restrictions Precautions Precautions: Fall Restrictions Weight Bearing Restrictions: No      Mobility  Bed Mobility Overal bed mobility: Needs Assistance Bed Mobility: Supine to Sit     Supine to sit: Max assist;+2 for physical assistance;Modified independent (Device/Increase time)     General bed mobility comments: Pt requires max assist + 2 for physical support. Simple cues required for pt to move in the direction you wish.   Transfers Overall transfer level: Needs assistance Equipment used: Rolling walker (2 wheeled) Transfers: Sit to/from Stand Sit to Stand: Max assist;+2 physical assistance         General transfer comment: Pt requires max assist +2 for lifting. Gross LE weakness. Pt can not  stand alone and does not have the strength to get her hips under her BOS.   Ambulation/Gait             General Gait Details: Pt unable to ambulate secondary to gross LE weakness and confusion.   Stairs            Wheelchair Mobility    Modified Rankin (Stroke Patients Only)       Balance Overall balance assessment: Needs assistance     Sitting balance - Comments: Needs min assist to sit on EOB, leans posteriorly.        Standing balance comment: Requires max assist +2 and RW for support. Leans posteriorly.                              Pertinent Vitals/Pain Pain Assessment: No/denies pain    Home Living Family/patient expects to be discharged to:: Assisted living               Home Equipment: Walker - 2 wheels Additional Comments: Pt was previously living at [facility] assisted living. Not receiving PT.     Prior Function Level of Independence: Independent with assistive device(s)   Gait / Transfers Assistance Needed: Pt was modified independent with transfers and short distance ambulation   ADL's / Homemaking Assistance Needed: Pt received assistance with cooking/cleaning.         Hand Dominance        Extremity/Trunk Assessment   Upper Extremity Assessment: Difficult to assess due to impaired cognition  Lower Extremity Assessment: Generalized weakness         Communication   Communication: Expressive difficulties;Receptive difficulties (Quiet speech, dementia. )  Cognition Arousal/Alertness: Lethargic Behavior During Therapy: Flat affect (Pt was able to participate in therapy today. Big improvement) Overall Cognitive Status: Impaired/Different from baseline Area of Impairment: Following commands       Following Commands: Follows one step commands inconsistently;Follows one step commands with increased time       General Comments: Pt was more alert and attended to task better than yesterday. There  still is a delay and not all questions or demands are met by pt. Requires simple cueing for task completion.     General Comments      Exercises        Assessment/Plan    PT Assessment Patient needs continued PT services  PT Diagnosis Difficulty walking;Abnormality of gait;Generalized weakness;Altered mental status   PT Problem List Decreased strength;Decreased activity tolerance;Decreased balance;Decreased mobility;Decreased cognition;Decreased knowledge of use of DME;Decreased safety awareness;Decreased knowledge of precautions  PT Treatment Interventions DME instruction;Gait training;Stair training;Functional mobility training;Therapeutic activities;Therapeutic exercise;Balance training;Neuromuscular re-education;Patient/family education   PT Goals (Current goals can be found in the Care Plan section) Acute Rehab PT Goals PT Goal Formulation: With family Time For Goal Achievement: 01/08/15 Potential to Achieve Goals: Fair    Frequency Min 2X/week   Barriers to discharge        Co-evaluation               End of Session Equipment Utilized During Treatment: Gait belt Activity Tolerance: Patient limited by fatigue;Patient limited by lethargy (Patient limited by cognitive deficits ) Patient left: in chair;with call bell/phone within reach;with chair alarm set;with family/visitor present Nurse Communication: Mobility status         Time: 0165-5374 PT Time Calculation (min) (ACUTE ONLY): 18 min   Charges:         PT G CodesJanyth Contes December 26, 2014, 10:02 AM  Janyth Contes, SPT. [phone]

## 2014-12-25 NOTE — Discharge Instructions (Signed)
Low sodium and low fat diet. °Activity as tolerated. °

## 2014-12-25 NOTE — Plan of Care (Signed)
Problem: Discharge Progression Outcomes Goal: Other Discharge Outcomes/Goals Outcome: Progressing Patient worked with PT today, possible d/c tomorrow to [employer] daughter at bedside.   No c/o pain  VSS Patient is tolerating diet well

## 2014-12-25 NOTE — Consult Note (Signed)
Palliative Medicine Inpatient Consult Follow Up Note   Name: Bridget Livingston Date: 12/25/2014 MRN: [id number]  DOB: 1915-03-27  Referring Physician: Shaune Pollack, MD  Palliative Care consult requested for this 81 y.o. female for goals of medical therapy in patient with PVD, h/o CVA, afib, and HLD admitted with acute renal failure and diarrhea  Ms Nassiri is lying in bed. Much more alert. Working with PT. Family not present.   REVIEW OF SYSTEMS:  Patient is not able to provide ROS   CODE STATUS: DNR   PAST MEDICAL HISTORY: Past Medical History  Diagnosis Date  . Hypertension   . Hyperlipidemia   . Stroke   . Peripheral vascular disease   . Atrial fibrillation   . Anxiety     PAST SURGICAL HISTORY:  Past Surgical History  Procedure Laterality Date  . Abdominal hysterectomy      Vital Signs: BP 116/58 mmHg  Pulse 83  Temp(Src) 98.5 F (36.9 C) (Oral)  Resp 18  Ht 5\' 2"  (1.575 m)  Wt 52.759 kg (116 lb 5 oz)  BMI 21.27 kg/m2  SpO2 98% Filed Weights   12/23/14 0933 12/24/14 0950  Weight: 45.36 kg (100 lb) 52.759 kg (116 lb 5 oz)    Estimated body mass index is 21.27 kg/(m^2) as calculated from the following:   Height as of this encounter: 5\' 2"  (1.575 m).   Weight as of this encounter: 52.759 kg (116 lb 5 oz).  PHYSICAL EXAM: General: elderly female, NAD HEENT: OP clear, hearing intact Neck: Trachea midline  Cardiovascular: regular rate and rhythm Pulmonary/Chest: Clear ant fields Abdominal: Soft, nontender, + bowel sounds GU: No SP tenderness Extremities: trace edema, SCD's in place Neurological: awakens to voice, follows simple commands Skin: no rashes Psychiatric: lethargic  LABS: CBC:    Component Value Date/Time   WBC 7.3 12/24/2014 0507   WBC 10.3 06/02/2013 2004   HGB 10.8* 12/24/2014 0507   HGB 12.8 06/02/2013 2004   HCT 32.7* 12/24/2014 0507   HCT 37.3 06/02/2013 2004   PLT 220 12/24/2014 0507   PLT 383 06/02/2013 2004   MCV 88.2  12/24/2014 0507   MCV 88 06/02/2013 2004   NEUTROABS 9.6* 12/05/2012 0222   LYMPHSABS 0.8* 12/05/2012 0222   MONOABS 1.1* 12/05/2012 0222   EOSABS 0.0 12/05/2012 0222   BASOSABS 0.0 12/05/2012 0222   Comprehensive Metabolic Panel:    Component Value Date/Time   NA 136 12/25/2014 0526   NA 133* 06/02/2013 2004   K 4.3 12/25/2014 0526   K 4.1 06/02/2013 2004   CL 113* 12/25/2014 0526   CL 101 06/02/2013 2004   CO2 20* 12/25/2014 0526   CO2 26 06/02/2013 2004   BUN 26* 12/25/2014 0526   BUN 22* 06/02/2013 2004   CREATININE 1.08* 12/25/2014 0526   CREATININE 0.77 06/02/2013 2004   GLUCOSE 111* 12/25/2014 0526   GLUCOSE 95 06/02/2013 2004   CALCIUM 7.5* 12/25/2014 0526   CALCIUM 9.0 06/02/2013 2004   AST 24 12/23/2014 1102   AST 37 12/05/2012 0222   ALT 19 12/23/2014 1102   ALT 30 12/05/2012 0222   ALKPHOS 66 12/23/2014 1102   ALKPHOS 66 12/05/2012 0222   BILITOT 0.7 12/23/2014 1102   PROT 7.6 12/23/2014 1102   PROT 6.7 12/05/2012 0222   ALBUMIN 3.3* 12/23/2014 1102   ALBUMIN 3.3* 12/05/2012 0222    IMPRESSION: Ms Fender is a 81 year old woman with a PMH ofCVA, PVD, afib, HTN, hyperlipidemia, s/p abdominal hysterectomy. She  was admitted 12/23/14 with diarrhea and altered mental status. Workup significant for ARF, a.fib with RVE. Pt is C.diff negative and is being treated for presumed UTI. Urine culture pending.   Pt is much more alert this AM though still lethargic. She awakens to stimulation and follows simple commands. Ate some breakfast, working with PT. Suspect pt will return to baseline (at baseline walks with walker, feeds, bathes and dresses self).Will likely d/c back to ALF.   After discussion with daughter/HCPOA, pt is DNR.   PLAN: DNR  More than 50% of the visit was spent in counseling/coordination of care: YES  Time spent: 25 minutes

## 2014-12-25 NOTE — Discharge Summary (Addendum)
[facility] [place] [facility]   PATIENT NAME: Bridget Livingston    MR#:  [id number]  DATE OF BIRTH:  1915-02-17  DATE OF ADMISSION:  12/23/2014 ADMITTING PHYSICIAN: Auburn Bilberry, MD  DATE OF DISCHARGE: 12/26/2014  PRIMARY CARE PHYSICIAN: No primary care provider on file.    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 Hypertension   . Hyperlipidemia   . Stroke   . Peripheral vascular disease   . Atrial fibrillation   . Anxiety     HOSPITAL COURSE:  Bridget Livingston is a 81 y.o. female with a known history of hypertension hyperlipidemia, CVA, peripheral vascular disease, atrial fibrillation and general anxiety disorder. Who is brought into the emergency room with complaint of having diarrhea for the past 5 days. And has had progressive weakness. Her daughters the bedside who provides all the history. Patient is very agitated and refuses to answer any questions. Daughter reports that the diarrhea started about 5 days ago but is trending downwards she has had multiple liquidy bowel movements without any blood. She is not sure of any recent anabiotic usage. Patient did not complain of any abdominal pain. She has not been eating or drinking much and by mouth intake is very poor.  1. Diarrhea. Resolved. possible viral in nature, negative stool cultures and C. Difficile.  * UTI: Urine analysis showed UTI. The patient has been treated with IV Rocephin for 3 days.  2. Acute renal failure. Improved after IV fluid support.  Hypokalemia. Improved after giving potassium supplement. 3. Hypernatremia: Improved.  4. Anxiety anxiety disorder: Continue Seroquel 5. Hyperlipidemia continue atorvastatin 6. Hypertension:  lisinopril was on  hold due to acute renal failure. She has been treated with metoprolol and norvasc. We will resume lisinopril since renal function improved.  The patient will be discharged to SNF today.   DISCHARGE CONDITIONS:   Stable.  CONSULTS OBTAINED:  Treatment Team:  Auburn Bilberry, MD  DRUG ALLERGIES:   Allergies  Allergen Reactions  . Banana Nausea And Vomiting    DISCHARGE MEDICATIONS:   Current Discharge Medication List    CONTINUE these medications which have NOT CHANGED   Details  acetaminophen (TYLENOL) 325 MG tablet Take 650 mg by mouth every 4 (four) hours as needed for mild pain or fever.    amLODipine (NORVASC) 5 MG tablet Take 5 mg by mouth daily.    atorvastatin (LIPITOR) 20 MG tablet Take 20 mg by mouth daily.    betamethasone dipropionate (DIPROLENE) 0.05 % cream Apply 1 application topically every 12 (twelve) hours as needed (rash).    Calcium Carb-Cholecalciferol (CALCIUM + D3) 600-200 MG-UNIT TABS Take 1 tablet by mouth daily.    clopidogrel (PLAVIX) 75 MG tablet Take 75 mg by mouth daily.    diphenhydrAMINE (BENADRYL) 25 MG tablet Take 25 mg by mouth every 6 (six) hours as needed for itching.    lisinopril (PRINIVIL,ZESTRIL) 20 MG tablet Take 20 mg by mouth daily.    loratadine (CLARITIN) 10 MG tablet Take 10 mg by mouth daily as needed for itching.    LORazepam (ATIVAN) 0.5 MG tablet Take 0.5 mg by mouth every 8 (eight) hours as needed for anxiety.    metoprolol tartrate (LOPRESSOR) 25 MG tablet Take 25 mg by mouth 2 (two) times daily.  Multiple Vitamins-Minerals (SENIOR MULTIVITAMIN PLUS) TABS Take 1 tablet by mouth daily.    !! QUEtiapine (SEROQUEL) 25 MG tablet Take 25 mg by mouth daily.    !! QUEtiapine (SEROQUEL) 50 MG tablet Take 50 mg by mouth every evening.    triamcinolone cream (KENALOG) 0.1 % Apply 1 application topically every 12 (twelve) hours as needed (rash).     !! - Potential duplicate medications found. Please discuss with  provider.       DISCHARGE INSTRUCTIONS:    If you experience worsening of your admission symptoms, develop shortness of breath, life threatening emergency, suicidal or homicidal thoughts you must seek medical attention immediately by calling 911 or calling your MD immediately  if symptoms less severe.  You Must read complete instructions/literature along with all the possible adverse reactions/side effects for all the Medicines you take and that have been prescribed to you. Take any new Medicines after you have completely understood and accept all the possible adverse reactions/side effects.   Please note  You were cared for by a hospitalist during your hospital stay. If you have any questions about your discharge medications or the care you received while you were in the hospital after you are discharged, you can call the unit and asked to speak with the hospitalist on call if the hospitalist that took care of you is not available. Once you are discharged, your primary care physician will handle any further medical issues. Please note that NO REFILLS for any discharge medications will be authorized once you are discharged, as it is imperative that you return to your primary care physician (or establish a relationship with a primary care physician if you do not have one) for your aftercare needs so that they can reassess your need for medications and monitor your lab values.    Today   SUBJECTIVE   The patient is demented and confused.   VITAL SIGNS:  Blood pressure 130/70, pulse 92, temperature 98.3 F (36.8 C), temperature source Oral, resp. rate 18, height  (1.575 m), weight 52.759 kg (116 lb 5 oz), SpO2 99 %.  I/O:   Intake/Output Summary (Last 24 hours) at 12/25/14 1201 Last data filed at 12/25/14 0519  Gross per 24 hour  Intake 1041.67 ml  Output      0 ml  Net 1041.67 ml    PHYSICAL EXAMINATION:  GENERAL:  81 y.o.-year-old patient lying in the bed with no acute  distress.  EYES:  No scleral icterus. Extraocular muscles intact.  HEENT: Head atraumatic, normocephalic. NECK:  Supple, no jugular venous distention. No thyroid enlargement, no tenderness.  LUNGS: Normal breath sounds bilaterally, no wheezing, rales,rhonchi or crepitation. No use of accessory muscles of respiration.  CARDIOVASCULAR: S1, S2 normal. No murmurs, rubs, or gallops.  ABDOMEN: Soft, non-tender, non-distended. Bowel sounds present. No organomegaly or mass.  EXTREMITIES: No pedal edema, cyanosis, or clubbing.  NEUROLOGIC: unable to exam, not follow commands. PSYCHIATRIC: The patient is demented. SKIN: No obvious rash, lesion, or ulcer.   DATA REVIEW:   CBC  Recent Labs Lab 12/24/14 0507  WBC 7.3  HGB 10.8*  HCT 32.7*  PLT 220    Chemistries   Recent Labs Lab 12/23/14 1102 12/24/14 0507 12/25/14 0526  NA 150* 142 136  K 3.4* 3.0* 4.3  CL 114* 116* 113*  CO2 22 20* 20*  GLUCOSE 116* 108* 111*  BUN 47* 35* 26*  CREATININE 1.67* 1.26* 1.08*  CALCIUM 9.2 7.9* 7.5*  MG  --  1.9  --   AST 24  --   --   ALT 19  --   --   ALKPHOS 66  --   --   BILITOT 0.7  --   --     Cardiac Enzymes No results for input(s): TROPONINI in the last 168 hours.  Microbiology Results  Results for orders placed or performed during the hospital encounter of 12/23/14  Stool culture     Status: None (Preliminary result)   Collection Time: 12/24/14  4:45 AM  Result Value Ref Range Status   Specimen Description STOOL  Final   Special Requests NONE  Final   Culture   Final    HOLDING FOR POSSIBLE PATHOGEN No Pathogenic E. coli detected NO CAMPYLOBACTER DETECTED    Report Status PENDING  Incomplete  C difficile quick scan w PCR reflex ([facility] only)     Status: None   Collection Time: 12/24/14  4:45 AM  Result Value Ref Range Status   C Diff antigen NEGATIVE  Final   C Diff toxin NEGATIVE  Final   C Diff interpretation Negative for C. difficile  Final  Urine culture     Status:  None (Preliminary result)   Collection Time: 12/24/14 11:03 AM  Result Value Ref Range Status   Specimen Description URINE, RANDOM  Final   Special Requests NONE  Final   Culture NO GROWTH < 24 HOURS  Final   Report Status PENDING  Incomplete    RADIOLOGY:  No results found.      Discharge plans discussed with the patient's daughter, RN and social worker, and they are in agreement.  CODE STATUS:     Code Status Orders        Start     Ordered   12/24/14 1107  Do not attempt resuscitation (DNR)   Continuous    Question Answer Comment  In the event of cardiac or respiratory ARREST Do not call a "code blue"   In the event of cardiac or respiratory ARREST Do not perform Intubation, CPR, defibrillation or ACLS   In the event of cardiac or respiratory ARREST Use medication by any route, position, wound care, and other measures to relive pain and suffering. May use oxygen, suction and manual treatment of airway obstruction as needed for comfort.      12/24/14 1107    Advance Directive Documentation        Most Recent Value   Type of Advance Directive  Healthcare Power of Attorney   Pre-existing out of facility DNR order (yellow form or pink MOST form)     "MOST" Form in Place?        TOTAL TIME TAKING CARE OF THIS PATIENT: 37 minutes.    Shaune Pollack M.D on 12/26/2014 at 08:47 AM Between 7am to 6pm - Pager - [phone]  After 6pm go to [email or web address] - password [email or web address] [facility]  [facility]  Office  [phone]  CC: Primary care physician; No primary care provider on file.

## 2014-12-25 NOTE — Progress Notes (Signed)
[facility] - [place] at [facility]   PATIENT NAME: Bridget Livingston    MR#:  [id number]  DATE OF BIRTH:  June 18, 1915  SUBJECTIVE:  CHIEF COMPLAINT:   Chief Complaint  Patient presents with  . Weakness   the patient is demented and confused but in no acute distress.  REVIEW OF SYSTEMS:  Unable to get up review of systems.   DRUG ALLERGIES:   Allergies  Allergen Reactions  . Banana Nausea And Vomiting    VITALS:  Blood pressure 130/70, pulse 92, temperature 98.3 F (36.8 C), temperature source Oral, resp. rate 18, height 5\' 2"  (1.575 m), weight 52.759 kg (116 lb 5 oz), SpO2 99 %.  PHYSICAL EXAMINATION:  GENERAL:  81 y.o.-year-old patient lying in the bed with no acute distress.  EYES:  No scleral icterus. Extraocular muscles intact.  HEENT: Head atraumatic, normocephalic. NECK:  Supple, no jugular venous distention. No thyroid enlargement, no tenderness.  LUNGS: Normal breath sounds bilaterally, no wheezing, rales,rhonchi or crepitation. No use of accessory muscles of respiration.  CARDIOVASCULAR: S1, S2 normal. No murmurs, rubs, or gallops.  ABDOMEN: Soft, nontender, nondistended. Bowel sounds present. No organomegaly or mass.  EXTREMITIES: No pedal edema, cyanosis, or clubbing.  NEUROLOGIC: Follow limited commands and Unable to exam at this time. PSYCHIATRIC: Demented and confused.  SKIN: No obvious rash, lesion, or ulcer.    LABORATORY PANEL:   CBC  Recent Labs Lab 12/24/14 0507  WBC 7.3  HGB 10.8*  HCT 32.7*  PLT 220   ------------------------------------------------------------------------------------------------------------------  Chemistries   Recent Labs Lab 12/23/14 1102 12/24/14 0507 12/25/14 0526  NA 150* 142 136  K 3.4* 3.0* 4.3  CL 114* 116* 113*  CO2 22 20* 20*  GLUCOSE 116* 108* 111*  BUN 47* 35* 26*  CREATININE 1.67* 1.26* 1.08*  CALCIUM 9.2 7.9* 7.5*  MG  --  1.9  --   AST 24  --   --   ALT 19  --   --    ALKPHOS 66  --   --   BILITOT 0.7  --   --    ------------------------------------------------------------------------------------------------------------------  Cardiac Enzymes No results for input(s): TROPONINI in the last 168 hours. ------------------------------------------------------------------------------------------------------------------  RADIOLOGY:  No results found.  EKG:   Orders placed or performed during the hospital encounter of 12/23/14  . EKG 12-Lead  . EKG 12-Lead    ASSESSMENT AND PLAN:    1. Diarrhea. Resolved.  possible viral in nature, negative stool cultures and C. Difficile.  * UTI: Follow-up urine culture and continue Rocephin.  2. Acute renal failure. Improved,  discontinue IV fluid.  * Hypokalemia. Improved, after giving potassium supplement. 3. Hypernatremia: Improved.  4. Anxiety anxiety disorder: Continue Seroquel 5. Hyperlipidemia continue atorvastatin 6. Hypertension: resume  lisinopril continue metoprolol and norvasc.     All the records are reviewed and case discussed with Care Management/Social Workerr. Management plans discussed with the patient's daughter and she is in agreement. Discussed with Dr. Harvie Junior.  CODE STATUS:  DNR.    TOTAL TIME TAKING CARE OF THIS PATIENT: 35 minutes.   POSSIBLE D/C tomorrow, DEPENDING ON CLINICAL CONDITION.   Shaune Pollack M.D on 12/25/2014 at 1:12 PM  Between 7am to 6pm - Pager - [phone]  After 6pm go to [email or web address] - password [email or web address] [facility]  [facility]  Office  [phone]  CC: Primary care physician; No primary care provider on file.

## 2014-12-25 NOTE — Clinical Social Work Note (Signed)
Per MD, pt will be ready for discharge tomorrow. CSW notified facility. Discharge summary has been sent to facility for Saturday discharge. CSW will continue to follow.   Dede Query, MSW, LCSW Clinical Social Worker [phone]

## 2014-12-25 NOTE — Plan of Care (Signed)
Problem: Discharge Progression Outcomes Goal: Discharge plan in place and appropriate Outcome: Progressing Individualization: Patient prefers to be called Bridget Livingston. Patient is from [facility]. Has a history of hypertension, hyperlipidemia, CVA, peripheral vascular disease, atrial fibrillation and general anxiety disorder - controlled by home medications. Daughter is POA.  Goal: Other Discharge Outcomes/Goals Outcome: Progressing Pt remains disoriented x3. Does not complain of pain or discomfort, tolerating diet well, will continue to monitor.

## 2014-12-26 LAB — URINE CULTURE: Culture: >=100000

## 2014-12-26 NOTE — Progress Notes (Signed)
Patient is medically stable for D/C to [facility] today. Per staff member at [facility] patient is going to room 412 today. RN will call report to 400 hall RN and arrange EMS for transport. Clinical Child psychotherapist (CSW) prepared D/C packet and D/C Summary was sent to [facility] on Friday. Patient's daughter Christeen Douglas was at bedside and aware of above. Please reconsult if future social work needs arise. CSW signing off.   Jetta Lout, LCSWA [phone]

## 2014-12-26 NOTE — Progress Notes (Addendum)
Dgt arrived this a.m with concern over discharge to SNF at [facility]. Pt responds readily to touch, pulls away to touch,opens eyes, strikes out at staff "get away.", moves alls extremities. Slept at intervals. Refused a.m meds. TC to Dr. Imogene Burn with dgt speaking with him twice with facility [facility] tubed to Dr. Imogene Burn for review. Pt has received no ativan since Thurday a.m.Jaclyn Prime to [place], SW discharge planner, in and spoke with dgt. Dgt now agreeable for pt discharge to SNF at [facility]. Emotional support given with active listening done with dgt.  TC report to [place] at [facility] with pt accepted. EMS called to transport pt. Dgt at bedside.

## 2014-12-26 NOTE — Progress Notes (Signed)
[place] [facility] here to transport pt to [facility]; discharge packet given. Pt ate only few bites of lunch by other family. Dgt remained at bedside until discharge. Discharged to SNF at [facility].

## 2014-12-26 NOTE — Plan of Care (Signed)
Problem: Discharge Progression Outcomes Goal: Discharge plan in place and appropriate Outcome: Progressing Patient prefers to be called Bridget Livingston. Patient is from [facility]. Has a history of hypertension, hyperlipidemia, CVA, peripheral vascular disease, atrial fibrillation and general anxiety disorder - controlled by home medications. Daughter is POA.   Goal: Other Discharge Outcomes/Goals Outcome: Progressing Pt remains disoriented x3. No signs of pain or discomfort. Pt continues to be incontinent of urine, no bowel movement this shift. Will continue to monitor.

## 2015-03-04 DEATH — deceased

## 2015-04-03 DEATH — deceased

## 2015-04-06 LAB — STOOL CULTURE

## 2015-11-02 IMAGING — CT CT CERVICAL SPINE WITHOUT CONTRAST
3 of 4 series · 11 of 33 positions shown, 13 images · non-contrast
Comparison: None.

CLINICAL DATA: Fall

EXAM:
CT CERVICAL SPINE WITHOUT CONTRAST
TECHNIQUE: Multidetector CT imaging of the cervical spine was performed without
intravenous contrast. Multiplanar CT image reconstructions were also
generated.

[Series 4: sag bone · sagittal · 0.20mm/px · 5 of 85 slices shown, 6 images]
[im 29/85  bone]
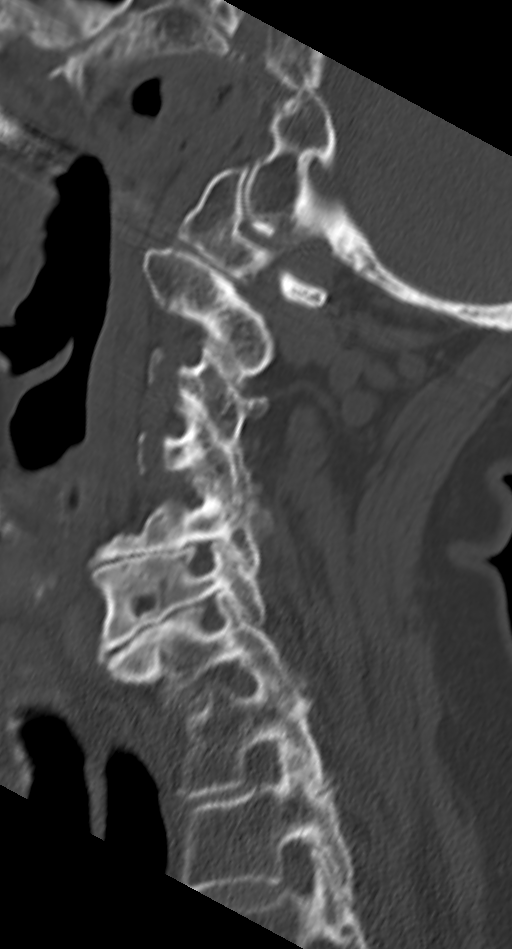
[im 36/85  bone]
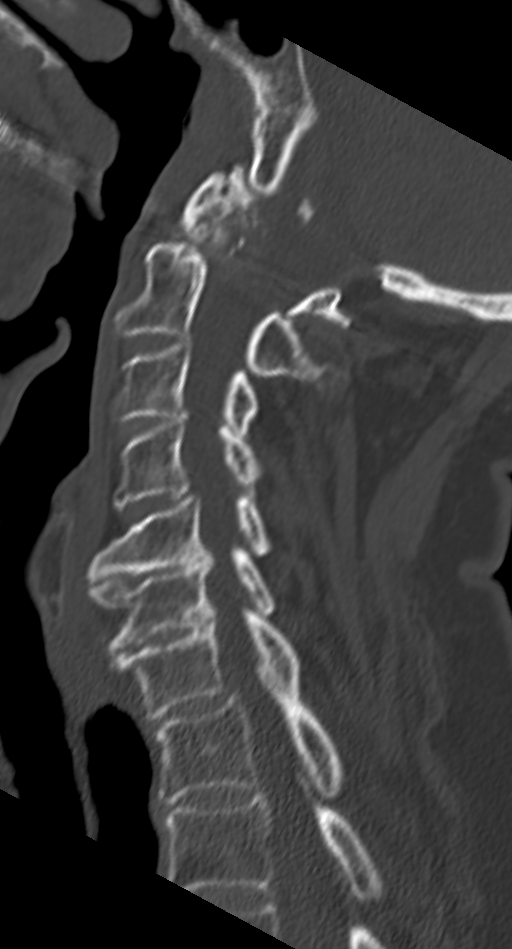
[im 43/85  soft-tissue]
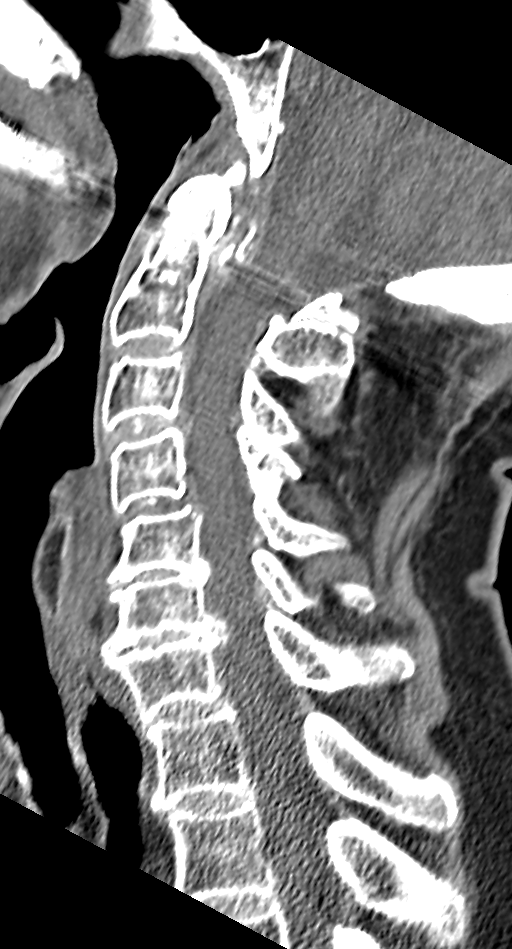
[im 43/85  bone]
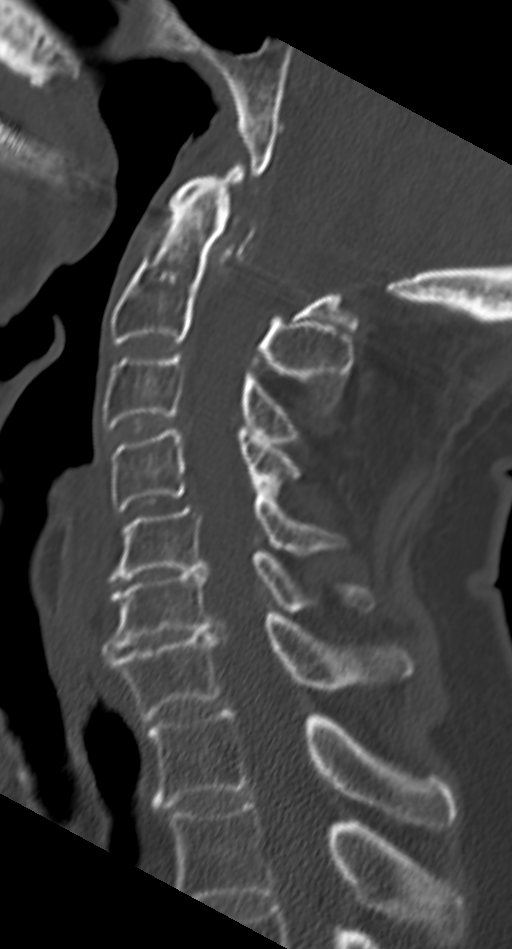
[im 50/85  bone]
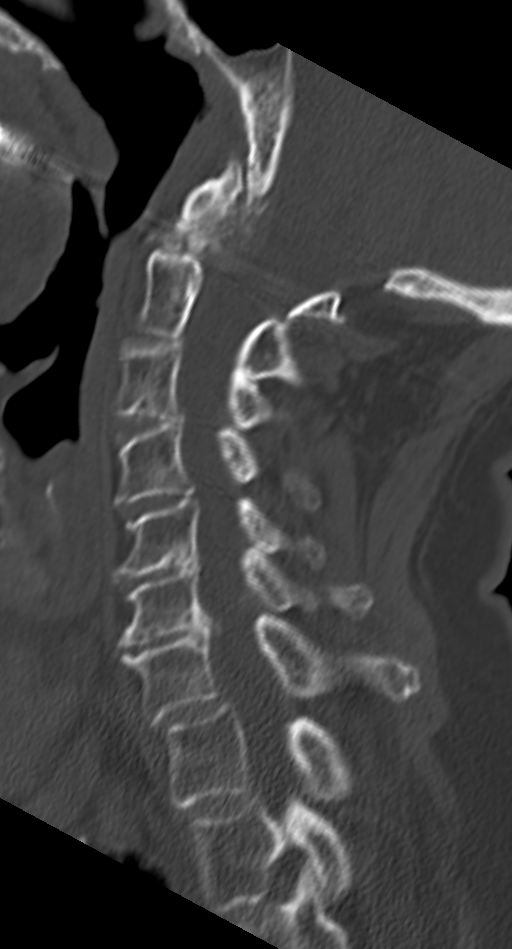
[im 57/85  bone]
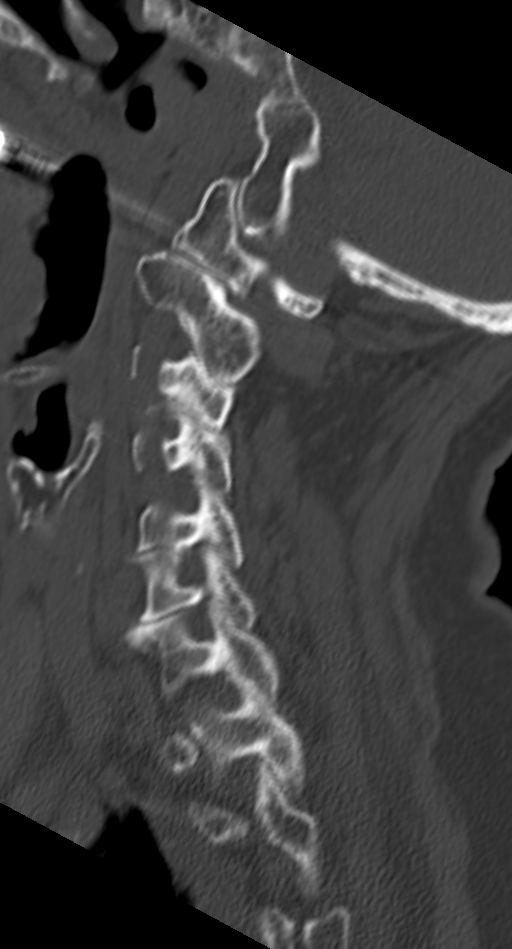

[Series 5: cor bone · coronal · 0.19mm/px · 3 of 83 slices shown]
[im 17/83  bone]
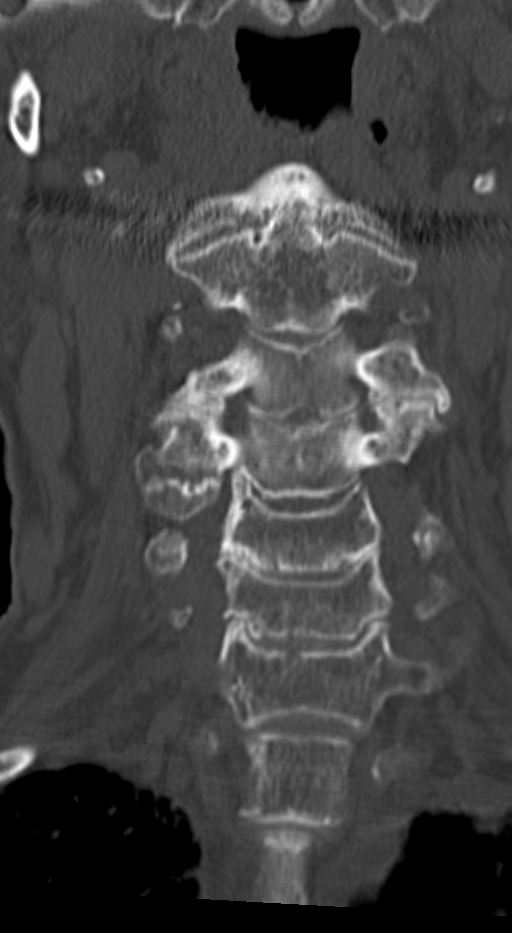
[im 33/83  bone]
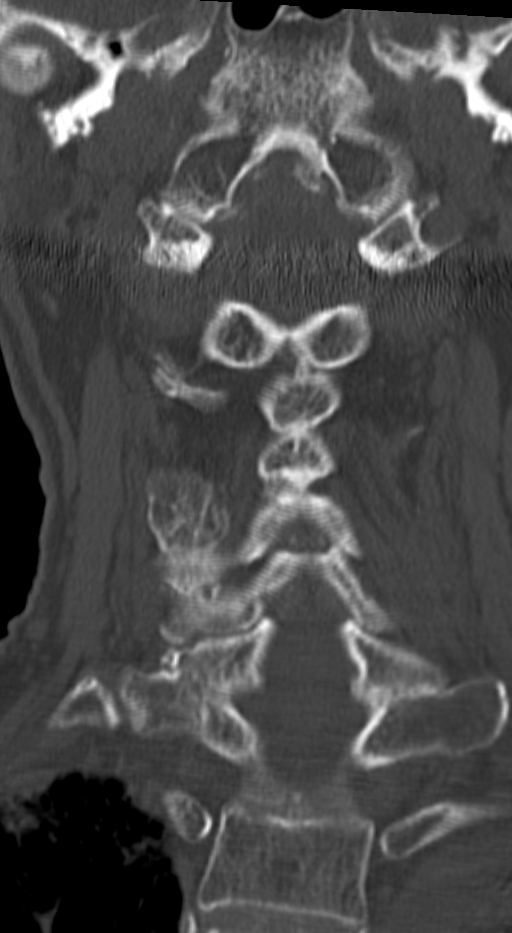
[im 50/83  bone]
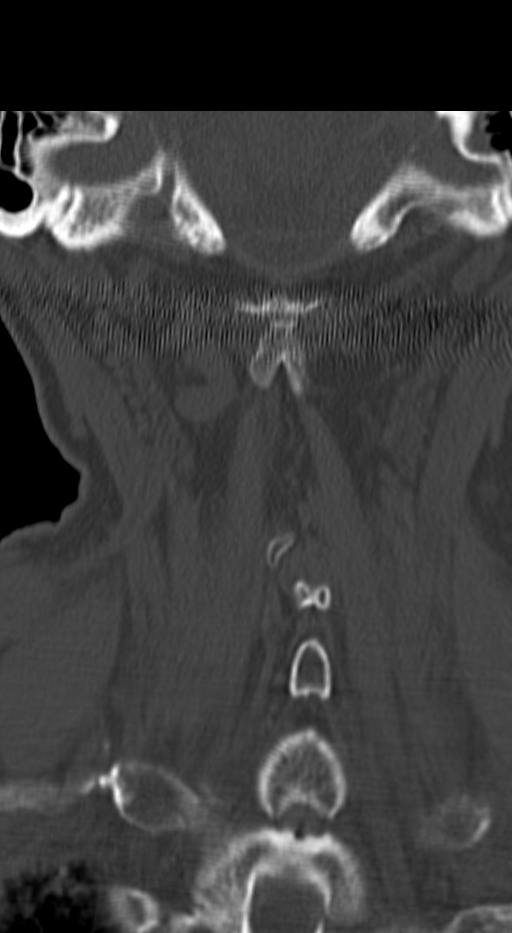

[Series 6: orthogonal axials · axial · 0.29mm/px · z∈[-320,-227]mm · 3 of 157 slices shown, 4 images]
[im 27/157  soft-tissue]
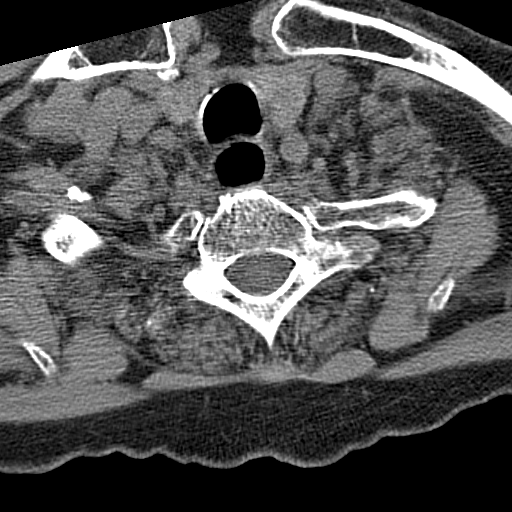
[im 27/157  bone]
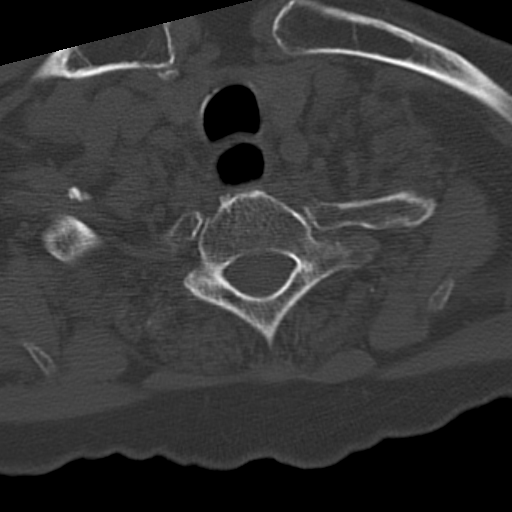
[im 79/157  bone]
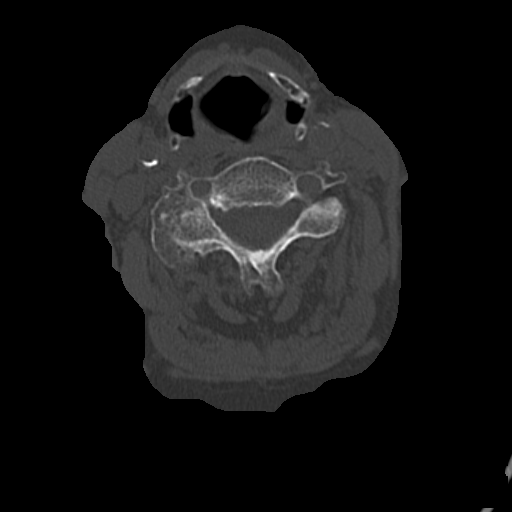
[im 131/157  bone]
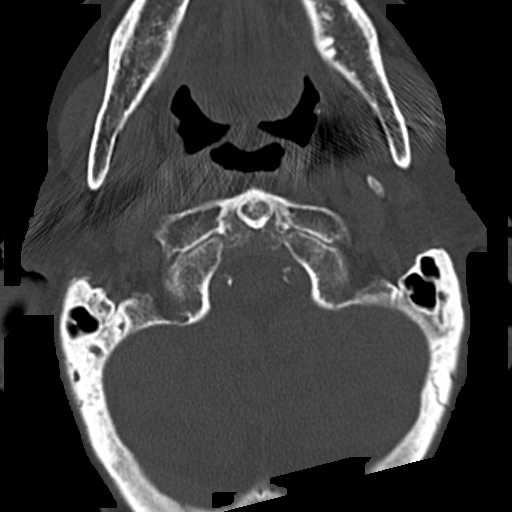

[11 of 33 positions shown; findings below may reference images not displayed]

FINDINGS: There is normal anterior-posterior alignment with no prevertebral
soft tissue swelling. There is mild C4-5 degenerative disc disease.
There is moderate C5-6 and C6-7 degenerative disc disease. There is
mild C7-T1 and T1-T2 degenerative disc disease. Throughout the
cervical spine there is multifocal bilateral severe facet
degeneration. There is carotid arterial calcification bilaterally.
There is evidence of a left pleural effusion which is only partially
visualized.
IMPRESSION: No evidence of fracture. Severe cervical spine degenerative change
noted.

Evidence of left pleural effusion.
